# Patient Record
Sex: Male | Born: 1970
Health system: Southern US, Community
[De-identification: ages and names within clinical notes are randomized; demographics above are authoritative.]

## PROBLEM LIST (undated history)

## (undated) DIAGNOSIS — I10 Essential (primary) hypertension: Secondary | ICD-10-CM

## (undated) HISTORY — PX: KNEE SURGERY: SHX244

## (undated) HISTORY — DX: Essential (primary) hypertension: I10

---
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
## 2015-11-06 ENCOUNTER — Ambulatory Visit: Payer: Self-pay

## 2015-11-06 ENCOUNTER — Other Ambulatory Visit: Payer: Self-pay | Admitting: Occupational Medicine

## 2015-11-06 DIAGNOSIS — M25562 Pain in left knee: Secondary | ICD-10-CM

## 2017-11-21 HISTORY — PX: ROTATOR CUFF REPAIR: SHX139

## 2019-08-14 ENCOUNTER — Encounter: Payer: Self-pay | Admitting: Family Medicine

## 2019-08-14 ENCOUNTER — Ambulatory Visit: Payer: 59 | Admitting: Family Medicine

## 2019-08-14 ENCOUNTER — Other Ambulatory Visit: Payer: Self-pay

## 2019-08-14 VITALS — BP 152/92 | HR 102 | Temp 97.9°F | Ht 70.0 in | Wt 268.0 lb

## 2019-08-14 DIAGNOSIS — L918 Other hypertrophic disorders of the skin: Secondary | ICD-10-CM

## 2019-08-14 DIAGNOSIS — I10 Essential (primary) hypertension: Secondary | ICD-10-CM | POA: Diagnosis not present

## 2019-08-14 DIAGNOSIS — D1801 Hemangioma of skin and subcutaneous tissue: Secondary | ICD-10-CM | POA: Insufficient documentation

## 2019-08-14 DIAGNOSIS — I781 Nevus, non-neoplastic: Secondary | ICD-10-CM | POA: Diagnosis not present

## 2019-08-14 MED ORDER — AMLODIPINE BESYLATE 5 MG PO TABS: 5.0000 mg | ORAL_TABLET | Freq: Every day | ORAL | 3 refills | Status: DC

## 2019-08-14 MED FILL — AMLODIPINE BESYLATE 5 MG TA: 5 | 30 days supply | Qty: 30 | Fill #0

## 2019-08-14 NOTE — Patient Instructions (Addendum)
Around 3 times per week, check your blood pressure 4 times per day. Twice in the morning and twice in the evening. The readings should be at least one minute apart. Write down these values and bring them to your next nurse visit/appointment.  When you check your BP, make sure you have been doing something calm/relaxing 5 minutes prior to checking. Both feet should be flat on the floor and you should be sitting. Use your left arm and make sure it is in a relaxed position (on a table), and that the cuff is at the approximate level/height of your heart.  Keep the diet clean and stay active.  Aim to do some physical exertion for 150 minutes per week. This is typically divided into 5 days per week, 30 minutes per day. The activity should be enough to get your heart rate up. Anything is better than nothing if you have time constraints.  There is nothing on your back that looks worrisome to be. There are some skin tags and other benign appearing lesions. If anything changes or new arises, please let me know.   Let us know if you need anything.

## 2019-08-14 NOTE — Progress Notes (Signed)
Chief Complaint  Patient presents with  . New Patient (Initial Visit)  . Hypertension       New Patient Visit SUBJECTIVE: HPI: Jonathon Colon is an 48 y.o.male who is being seen for establishing care.  The patient has not had a PCP in many years.  Hypertension Patient presents for hypertension follow up. He does not routinely monitor home blood pressures. He is not currently on any medications.  He has been on lisinopril in the past but it never works nor did he like the way it made him feel. He is sometimes adhering to a healthy diet overall. Exercise: active in yard and at work sometimes.  No chest pain or shortness of breath.  Over the past 6 months, his wife has noticed lesions on his back that have popped up.  Unsure if it is changing.  There are multiple.  No personal or family history of skin cancer.  There are other forms of cancer through his aunts and uncles including breast cancer, liver cancer, and brain cancer.  He is fair skinned and is compliant with sunscreen wearing recommendations, even as a child.   No Known Allergies  Past Medical History:  Diagnosis Date  . Hypertension    Past Surgical History:  Procedure Laterality Date  . ROTATOR CUFF REPAIR Right 2019  . TONSILLECTOMY  1974   Family History  Problem Relation Age of Onset  . COPD Mother   . Heart failure Father   . Hypertension Father   . Hyperlipidemia Father   . Hypertension Sister   . Diabetes Sister    No Known Allergies  Current Outpatient Medications:  .  amLODipine (NORVASC) 5 MG tablet, Take 1 tablet (5 mg total) by mouth daily., Disp: 30 tablet, Rfl: 3  ROS Cardiovascular: Denies chest pain  Respiratory: Denies dyspnea   OBJECTIVE: BP (!) 152/92 (BP Location: Left Arm, Patient Position: Sitting, Cuff Size: Large)   Pulse (!) 102   Temp 97.9 F (36.6 C) (Temporal)   Ht 5\' 10"  (1.778 m)   Wt 268 lb (121.6 kg)   SpO2 97%   BMI 38.45 kg/m   Constitutional: -  VS reviewed -   Well developed, well nourished, appears stated age -  No apparent distress  Psychiatric: -  Oriented to person, place, and time -  Memory intact -  Affect and mood normal -  Fluent conversation, good eye contact -  Judgment and insight age appropriate  Eye: -  Conjunctivae clear, no discharge -  Pupils symmetric, round, reactive to light  ENMT: -  MMM    Pharynx moist, no exudate, no erythema  Neck: -  No gross swelling, no palpable masses -  Thyroid midline, not enlarged, mobile, no palpable masses  Cardiovascular: -  RRR -  No bruits -  No LE edema  Respiratory: -  Normal respiratory effort, no accessory muscle use, no retraction -  Breath sounds equal, no wheezes, no ronchi, no crackles  Skin: -  Various skin tags, seborrheic keratoses, and angiomas on his back; there are also scattered lentigo; no suspicious lesions noted -  Warm and dry to palpation   ASSESSMENT/PLAN: Essential hypertension - Plan: amLODipine (NORVASC) 5 MG tablet  Skin tag  Cherry angioma  Add amlodipine.  Counseled on diet and exercise.  Recommended home blood pressure monitoring.  We will recheck in 1 month. Reassurance given for the skin lesions on his back.  Recommended if there is any concern, take a picture and send  it to be in my chart.  Skin lesions that are changing are of concern.  This is not the case with him. Patient should return in 1 month for a physical. The patient voiced understanding and agreement to the plan.   [place], DO 08/14/19  12:17 PM

## 2019-09-18 ENCOUNTER — Encounter: Payer: Self-pay | Admitting: Family Medicine

## 2019-09-18 ENCOUNTER — Ambulatory Visit (INDEPENDENT_AMBULATORY_CARE_PROVIDER_SITE_OTHER): Payer: 59 | Admitting: Family Medicine

## 2019-09-18 ENCOUNTER — Other Ambulatory Visit: Payer: Self-pay

## 2019-09-18 VITALS — BP 122/72 | HR 113 | Temp 97.9°F | Ht 70.0 in | Wt 272.4 lb

## 2019-09-18 DIAGNOSIS — M7918 Myalgia, other site: Secondary | ICD-10-CM

## 2019-09-18 DIAGNOSIS — Z1211 Encounter for screening for malignant neoplasm of colon: Secondary | ICD-10-CM

## 2019-09-18 DIAGNOSIS — R0681 Apnea, not elsewhere classified: Secondary | ICD-10-CM | POA: Insufficient documentation

## 2019-09-18 DIAGNOSIS — G4700 Insomnia, unspecified: Secondary | ICD-10-CM | POA: Insufficient documentation

## 2019-09-18 DIAGNOSIS — Z0000 Encounter for general adult medical examination without abnormal findings: Secondary | ICD-10-CM | POA: Diagnosis not present

## 2019-09-18 DIAGNOSIS — I10 Essential (primary) hypertension: Secondary | ICD-10-CM | POA: Diagnosis not present

## 2019-09-18 DIAGNOSIS — Z23 Encounter for immunization: Secondary | ICD-10-CM

## 2019-09-18 LAB — COMPREHENSIVE METABOLIC PANEL
ALT: 27 U/L (ref 0–53)
AST: 19 U/L (ref 0–37)
Albumin: 4.6 g/dL (ref 3.5–5.2)
Alkaline Phosphatase: 100 U/L (ref 39–117)
BUN: 11 mg/dL (ref 6–23)
CO2: 27 mEq/L (ref 19–32)
Calcium: 10 mg/dL (ref 8.4–10.5)
Chloride: 103 mEq/L (ref 96–112)
Creatinine, Ser: 0.77 mg/dL (ref 0.40–1.50)
GFR: 106.92 mL/min (ref >60.00)
Glucose, Bld: 132 mg/dL — ABNORMAL HIGH (ref 70–99)
Potassium: 4.4 mEq/L (ref 3.5–5.1)
Sodium: 139 mEq/L (ref 135–145)
Total Bilirubin: 0.5 mg/dL (ref 0.2–1.2)
Total Protein: 7.5 g/dL (ref 6.0–8.3)

## 2019-09-18 LAB — LIPID PANEL
Cholesterol: 154 mg/dL (ref 0–200)
HDL: 35.5 mg/dL — ABNORMAL LOW (ref >39.00)
NonHDL: 118.36
Total CHOL/HDL Ratio: 4
Triglycerides: 235 mg/dL — ABNORMAL HIGH (ref 0.0–149.0)
VLDL: 47 mg/dL — ABNORMAL HIGH (ref 0.0–40.0)

## 2019-09-18 LAB — CBC
HCT: 42.1 % (ref 39.0–52.0)
Hemoglobin: 14.8 g/dL (ref 13.0–17.0)
MCHC: 35.1 g/dL (ref 30.0–36.0)
MCV: 88.8 fl (ref 78.0–100.0)
Platelets: 296 10*3/uL (ref 150.0–400.0)
RBC: 4.75 Mil/uL (ref 4.22–5.81)
RDW: 13.4 % (ref 11.5–15.5)
WBC: 8.8 10*3/uL (ref 4.0–10.5)

## 2019-09-18 LAB — LDL CHOLESTEROL, DIRECT: Direct LDL: 98 mg/dL

## 2019-09-18 MED ORDER — TRAZODONE HCL 50 MG PO TABS: 25.0000 mg | ORAL_TABLET | Freq: Every evening | ORAL | 3 refills | Status: DC | PRN

## 2019-09-18 MED FILL — traZODone HCL 50 MG TABS: 50 | 30 days supply | Qty: 30 | Fill #0

## 2019-09-18 NOTE — Patient Instructions (Addendum)
Give [place] 2-3 business days to get the results of your labs back.   Keep the diet clean and stay active.  If you do not hear anything about your referral in the next 1-2 weeks, call our office and ask for an update.  The new Shingrix vaccine (for shingles) is a 2 shot series. It can make people feel low energy, achy and almost like they have the flu for 48 hours after injection. Please plan accordingly when deciding on when to get this shot. Call our office for a nurse visit appointment to get this. The second shot of the series is less severe regarding the side effects, but it still lasts 48 hours.    Bring your blood pressure monitor to your nurse visit. An alternative would be to have it checked at work.    Let us know if you need anything.  Healthy Eating Plan Many factors influence your heart health, including eating and exercise habits. Heart (coronary) risk increases with abnormal blood fat (lipid) levels. Heart-healthy meal planning includes limiting unhealthy fats, increasing healthy fats, and making other small dietary changes. This includes maintaining a healthy body weight to help keep lipid levels within a normal range.  WHAT IS MY PLAN?  Your health care provider recommends that you:  Drink a glass of water before meals to help with satiety.  Eat slowly.  An alternative to the water is to add Metamucil. This will help with satiety as well. It does contain calories, unlike water.  WHAT TYPES OF FAT SHOULD I CHOOSE?  Choose healthy fats more often. Choose monounsaturated and polyunsaturated fats, such as olive oil and canola oil, flaxseeds, walnuts, almonds, and seeds.  Eat more omega-3 fats. Good choices include salmon, mackerel, sardines, tuna, flaxseed oil, and ground flaxseeds. Aim to eat fish at least two times each week.  Avoid foods with partially hydrogenated oils in them. These contain trans fats. Examples of foods that contain trans fats are stick margarine, some tub  margarines, cookies, crackers, and other baked goods. If you are going to avoid a fat, this is the one to avoid!  WHAT GENERAL GUIDELINES DO I NEED TO FOLLOW?  Check food labels carefully to identify foods with trans fats. Avoid these types of options when possible.  Fill one half of your plate with vegetables and green salads. Eat 4-5 servings of vegetables per day. A serving of vegetables equals 1 cup of raw leafy vegetables,  cup of raw or cooked cut-up vegetables, or  cup of vegetable juice.  Fill one fourth of your plate with whole grains. Look for the word "whole" as the first word in the ingredient list.  Fill one fourth of your plate with lean protein foods.  Eat 4-5 servings of fruit per day. A serving of fruit equals one medium whole fruit,  cup of dried fruit,  cup of fresh, frozen, or canned fruit. Try to avoid fruits in cups/syrups as the sugar content can be high.  Eat more foods that contain soluble fiber. Examples of foods that contain this type of fiber are apples, broccoli, carrots, beans, peas, and barley. Aim to get 20-30 g of fiber per day.  Eat more home-cooked food and less restaurant, buffet, and fast food.  Limit or avoid alcohol.  Limit foods that are high in starch and sugar.  Avoid fried foods when able.  Cook foods by using methods other than frying. Baking, boiling, grilling, and broiling are all great options. Other fat-reducing suggestions include: ?  Removing the skin from poultry. ? Removing all visible fats from meats. ? Skimming the fat off of stews, soups, and gravies before serving them. ? Steaming vegetables in water or broth.  Lose weight if you are overweight. Losing just 5-10% of your initial body weight can help your overall health and prevent diseases such as diabetes and heart disease.  Increase your consumption of nuts, legumes, and seeds to 4-5 servings per week. One serving of dried beans or legumes equals  cup after being cooked,  one serving of nuts equals 1 ounces, and one serving of seeds equals  ounce or 1 tablespoon.  WHAT ARE GOOD FOODS CAN I EAT? Grains Grainy breads (try to find bread that is 3 g of fiber per slice or greater), oatmeal, light popcorn. Whole-grain cereals. Rice and pasta, including brown rice and those that are made with whole wheat. Edamame pasta is a great alternative to grain pasta. It has a higher protein content. Try to avoid significant consumption of white bread, sugary cereals, or pastries/baked goods.  Vegetables All vegetables. Cooked white potatoes do not count as vegetables.  Fruits All fruits, but limit pineapple and bananas as these fruits have a higher sugar content.  Meats and Other Protein Sources Lean, well-trimmed beef, veal, pork, and lamb. Chicken and [place] without skin. All fish and shellfish. Wild duck, rabbit, pheasant, and venison. Egg whites or low-cholesterol egg substitutes. Dried beans, peas, lentils, and tofu.Seeds and most nuts.  Dairy Low-fat or nonfat cheeses, including ricotta, string, and mozzarella. Skim or 1% milk that is liquid, powdered, or evaporated. Buttermilk that is made with low-fat milk. Nonfat or low-fat yogurt. Soy/Almond milk are good alternatives if you cannot handle dairy.  Beverages Water is the best for you. Sports drinks with less sugar are more desirable unless you are a highly active athlete.  Sweets and Desserts Sherbets and fruit ices. Honey, jam, marmalade, jelly, and syrups. Dark chocolate.  Eat all sweets and desserts in moderation.  Fats and Oils Nonhydrogenated (trans-free) margarines. Vegetable oils, including soybean, sesame, sunflower, olive, peanut, safflower, corn, canola, and cottonseed. Salad dressings or mayonnaise that are made with a vegetable oil. Limit added fats and oils that you use for cooking, baking, salads, and as spreads.  Other Cocoa powder. Coffee and tea. Most condiments.  The items listed above may  not be a complete list of recommended foods or beverages. Contact your dietitian for more options.  Gluteus Rehab It is normal to feel mild stretching, pulling, tightness, or discomfort as you do these exercises, but you should stop right away if you feel sudden pain or your pain gets worse.   Stretching and range of motion exercise This exercise warms up your muscles and joints and improves the movement and flexibility of your hip and pelvis. This exercise also helps to relieve pain and stiffness. Exercise A: Lunge (hip flexor stretch)     1. Kneel on the floor on your left / right knee. Bend your other knee so it is directly over your ankle. 2. Keep good posture with your head over your shoulders. Tuck your tailbone underneath you. This will prevent your back from arching too much. 3. You should feel a gentle stretch in the front of your thigh or hip. If you do not feel a stretch, slowly lunge forward with your chest up. 4. Hold this position for 30 seconds. 5. Slowly return to the starting position. Repeat 2 times. Complete this exercise 3 times per week. Strengthening exercises  These exercises build strength and endurance in your hip and pelvis. Endurance is the ability to use your muscles for a long time, even after they get tired. Exercise B: Bridge (hip extensors)    1. Lie on your back on a firm surface with your knees bent and your feet flat on the floor. 2. Tighten your buttocks muscles and lift your bottom off the floor until the trunk of your body is level with your thighs. ? You should feel the muscles working in your buttocks and the back of your thighs. If this exercise is too easy, cross your arms over your chest or lift one leg while your bottom is up off the floor. ? Do not arch your back. 3. Hold this position for 3 seconds. 4. Slowly lower your hips to the starting position. 5. Let your muscles relax completely between repetitions. Repeat 2 times. Complete this  exercise 3 times per week. Exercise C: Straight leg raises (hip abductors)    1. Lie on your side with your left / right leg in the top position. Lie so your head, shoulder, knee, and hip line up. Bend your bottom knee to help you balance. 2. Lift your top leg up 4-6 inches (10-15 cm), keeping your toes pointed straight ahead. 3. Hold this position for 2 seconds. 4. Slowly lower your leg to the starting position and let your muscles relax completely. Repeat for a total of 10 repetitions. Repeat 2 times. Complete this exercise 3 times per week. Exercise D: Hip abductors and external rotators, quadruped 1. Get on your hands and knees on a firm, lightly padded surface. Your hands should be directly below your shoulders, and your knees should be directly below your hips. 2. Lift your left / right knee out to the side. Keep your knee bent. Do not twist your body. 3. Hold this position for 3 seconds. 4. Slowly lower your leg. Repeat for a total of 10 repetitions.  Repeat 2 times. Complete this exercise 3 times per week. Exercise E: Single leg stand 1. Stand near a counter or door frame to hold onto as needed. It is helpful to look in a mirror for this exercise so you can watch your hip. 2. Squeeze your left / right buttock muscles then lift up your other foot. Do not let your left / righthip push out to the side. 3. Hold this position for 3 seconds. Repeat for a total of 10 repetitions. Repeat 2 times. Complete this exercise 3 times per week. Make sure you discuss any questions you have with your health care provider. Document Released: 11/07/2005 Document Revised: 07/14/2016 Document Reviewed: 10/20/2015 Elsevier Interactive Patient Education  [employer].

## 2019-09-18 NOTE — Progress Notes (Signed)
Chief Complaint  Patient presents with  . Annual Exam    Well Male Jonathon Colon is here for a complete physical.   His last physical was >1 year ago.  Current diet: in general, diet is fair.  Current exercise: active at home Weight trend: stable Awake time fatigue? No. Seat belt? Yes.    Health maintenance Shingrix- No Colonoscopy- No Tetanus- Yes HIV- Yes   Hypertension Patient presents for hypertension follow up. He does monitor home blood pressures. Blood pressures ranging on average from 140's/80's. He is compliant with medication- Norvasc. Patient has these side effects of medication: none  Patient has been experiencing witnessed apneic episodes.  He has never had a sleep study.  He does have trouble falling asleep.  Right shoulder pain will sometimes prevent him from sleeping on his right side, which is his usual side he sleeps on.  No racing thoughts, no issues with urination.  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  1.5 weeks ago, the patient pivoted on his right foot and felt a pop in his right buttock region.  He has been having issues ever since then.  Some difficulty walking.  Normal range of motion but is feeling an ache in that area.  Has not tried anything from a medicine standpoint.  No bruising, redness, or swelling.  Denies any weakness.   Past Medical History:  Diagnosis Date  . Hypertension       Past Surgical History:  Procedure Laterality Date  . ROTATOR CUFF REPAIR Right 2019  . TONSILLECTOMY  1974    Medications  Current Outpatient Medications on File Prior to Visit  Medication Sig Dispense Refill  . amLODipine (NORVASC) 5 MG tablet Take 1 tablet (5 mg total) by mouth daily. 30 tablet 3   Allergies No Known Allergies  Family History Family History  Problem Relation Age of Onset  . COPD Mother   . Heart failure Father   . Hypertension Father   . Hyperlipidemia Father   . Hypertension Sister   . Diabetes Sister     Review of  Systems: Constitutional:  no fevers Eye:  no recent significant change in vision Ear/Nose/Mouth/Throat:  Ears:  no hearing loss Nose/Mouth/Throat:  no complaints of nasal congestion, no sore throat Cardiovascular:  no chest pain, no palpitations Respiratory:  no cough and no shortness of breath Gastrointestinal:  no abdominal pain, no change in bowel habits GU:  Male: negative for dysuria, frequency, and incontinence and negative for prostate symptoms Musculoskeletal/Extremities: + Buttock pain as noted above, no pain, redness, or swelling of the joints Integumentary (Skin/Breast):  no abnormal skin lesions reported Neurologic:  no headaches Endocrine: No unexpected weight changes Hematologic/Lymphatic:  no abnormal bleeding  Exam BP 122/72 (BP Location: Left Arm, Patient Position: Sitting, Cuff Size: Large)   Pulse (!) 113   Temp 97.9 F (36.6 C) (Temporal)   Ht 5\' 10"  (1.778 m)   Wt 272 lb 6 oz (123.5 kg)   SpO2 97%   BMI 39.08 kg/m  General:  well developed, well nourished, in no apparent distress Skin:  no significant moles, warts, or growths Head:  no masses, lesions, or tenderness Eyes:  pupils equal and round, sclera anicteric without injection Ears:  canals without lesions, TMs shiny without retraction, no obvious effusion, no erythema Nose:  nares patent, septum midline, mucosa normal Throat/Pharynx:  lips and gingiva without lesion; tongue and uvula midline; non-inflamed pharynx; no exudates or postnasal drainage Neck: neck supple  without adenopathy, thyromegaly, or masses Lungs:  clear to auscultation, breath sounds equal bilaterally, no respiratory distress Cardio:  regular rate and rhythm (HR 84), 2+ pitting b/l LE edema, no bruits Abdomen:  abdomen soft, nontender; bowel sounds normal; no masses or organomegaly Rectal: Deferred Musculoskeletal: Mild tenderness over the cephalad portion of the right gluteus laterally, there is no tenderness over the greater  trochanter, no pain with resisted abduction; negative logroll, Valentino Hue, [place], otherwise symmetrical muscle groups noted without atrophy or deformity Extremities:  no clubbing, cyanosis, or edema, no deformities, no skin discoloration Neuro:  gait normal; deep tendon reflexes normal and symmetric Psych: well oriented with normal range of affect and appropriate judgment/insight  Assessment and Plan  Well adult exam - Plan: CBC, Comprehensive metabolic panel, Lipid panel  Need for influenza vaccination - Plan: Flu Vaccine QUAD 6+ mos PF IM (Fluarix Quad PF)  Screen for colon cancer - Plan: Ambulatory referral to Gastroenterology  Witnessed apneic spells - Plan: Ambulatory referral to Pulmonology  Insomnia, unspecified type - Plan: traZODone (DESYREL) 50 MG tablet  Buttock pain  Essential hypertension   Well 47 y.o. male. Counseled on diet and exercise. Counseled on risks and benefits of prostate cancer screening with PSA. The patient agrees to forego testing. Immunizations, labs, and further orders as above. Refer to pulmonology for witnessed apneic spells. Start trazodone for insomnia.  If this does not help, I would consider a shoulder injection as if he is not having any pain, he will be able to sleep on his preferred side. Stretches and exercises given for buttock pain. Continue Norvasc for now, I will have him check his blood pressure at home and bring his monitor in for a nurse visit where we will compare the readings and verify validity. Follow up for BP check in 1-2 weeks. The patient voiced understanding and agreement to the plan.  [place], DO 09/18/19 12:05 PM

## 2019-09-19 ENCOUNTER — Other Ambulatory Visit: Payer: Self-pay | Admitting: Family Medicine

## 2019-09-19 ENCOUNTER — Encounter: Payer: Self-pay | Admitting: Family Medicine

## 2019-09-19 ENCOUNTER — Other Ambulatory Visit (INDEPENDENT_AMBULATORY_CARE_PROVIDER_SITE_OTHER): Payer: 59

## 2019-09-19 DIAGNOSIS — R739 Hyperglycemia, unspecified: Secondary | ICD-10-CM

## 2019-09-19 LAB — HEMOGLOBIN A1C: Hgb A1c MFr Bld: 6.2 % (ref 4.6–6.5)

## 2019-09-19 NOTE — Progress Notes (Unsigned)
st

## 2019-09-20 ENCOUNTER — Other Ambulatory Visit: Payer: Self-pay | Admitting: Family Medicine

## 2019-09-20 DIAGNOSIS — E785 Hyperlipidemia, unspecified: Secondary | ICD-10-CM

## 2019-09-20 MED ORDER — GABAPENTIN 400 MG PO CAPS: 800.0000 mg | ORAL_CAPSULE | Freq: Every day | ORAL | 5 refills | Status: DC

## 2019-09-20 MED FILL — GABAPENTIN 400 MG CAPSULE: 400 | 30 days supply | Qty: 60 | Fill #0

## 2019-09-30 ENCOUNTER — Encounter: Payer: Self-pay | Admitting: Family Medicine

## 2019-10-03 ENCOUNTER — Other Ambulatory Visit (INDEPENDENT_AMBULATORY_CARE_PROVIDER_SITE_OTHER): Payer: 59

## 2019-10-03 ENCOUNTER — Other Ambulatory Visit: Payer: Self-pay

## 2019-10-03 DIAGNOSIS — E785 Hyperlipidemia, unspecified: Secondary | ICD-10-CM | POA: Diagnosis not present

## 2019-10-04 LAB — LIPID PANEL
Cholesterol: 157 mg/dL (ref 0–200)
HDL: 33.7 mg/dL — ABNORMAL LOW (ref >39.00)
LDL Cholesterol: 86 mg/dL (ref 0–99)
NonHDL: 123.44
Total CHOL/HDL Ratio: 5
Triglycerides: 185 mg/dL — ABNORMAL HIGH (ref 0.0–149.0)
VLDL: 37 mg/dL (ref 0.0–40.0)

## 2019-10-07 MED FILL — AMLODIPINE BESYLATE 5 MG TA: 5 | 90 days supply | Qty: 90 | Fill #1

## 2019-10-11 ENCOUNTER — Encounter: Payer: Self-pay | Admitting: Family Medicine

## 2019-10-11 ENCOUNTER — Ambulatory Visit: Payer: 59 | Admitting: Family Medicine

## 2019-10-11 ENCOUNTER — Other Ambulatory Visit: Payer: Self-pay

## 2019-10-11 VITALS — BP 140/102 | HR 117 | Temp 98.0°F | Ht 70.0 in | Wt 273.2 lb

## 2019-10-11 DIAGNOSIS — D489 Neoplasm of uncertain behavior, unspecified: Secondary | ICD-10-CM | POA: Diagnosis not present

## 2019-10-11 DIAGNOSIS — D171 Benign lipomatous neoplasm of skin and subcutaneous tissue of trunk: Secondary | ICD-10-CM | POA: Diagnosis not present

## 2019-10-11 NOTE — Patient Instructions (Signed)
Do not shower for the rest of the day. When you do wash it, use only soap and water. Do not vigorously scrub. Apply triple antibiotic ointment (like Neosporin) twice daily. Keep the area clean and dry.   Things to look out for: increasing pain not relieved by ibuprofen/acetaminophen, fevers, spreading redness, drainage of pus, or foul odor.  Let us know if you need anything. 

## 2019-10-12 ENCOUNTER — Encounter: Payer: Self-pay | Admitting: Family Medicine

## 2019-10-12 NOTE — Progress Notes (Addendum)
CC: Skin lesion removal  Pt a 48 yo WM here for skin lesion.   On R buttock, very irritating. Scaly and bleeds sometimes as well. No new lotions/topicals/detergents. Has not tried anything thus far. He would like it removed.  ROS Skin: +lesion on buttock  Past Medical History:  Diagnosis Date  . Hypertension    BP (!) 140/102 (BP Location: Left Arm, Patient Position: Sitting, Cuff Size: Large)   Pulse (!) 117   Temp 98 F (36.7 C) (Temporal)   Ht 5\' 10"  (1.778 m)   Wt 273 lb 4 oz (123.9 kg)   SpO2 97%   BMI 39.21 kg/m  Gen: awake, alert Lungs: no access msc use Psych: Age appropriate judgement and insight, normal affect and mood.  Skin: there is an appendage like structure measuring approx 3 cm in length. The base dimensions are 0.8 cm x 0.4 cm.   Procedure note; shave biopsy Informed consent was obtained. The area was cleaned with alcohol and injected with 2 mL of 1% lidocaine with epinephrine. A Dermablade was slightly bent and used to cut under the area of interest. The specimen was placed in a sterile specimen cup and sent to the lab. The area was then cauterized ensuring adequate hemostasis. The area was dressed with triple antibiotic ointment and a bandage. There were no complications noted. The patient tolerated the procedure well.  Neoplasm of uncertain behavior - Plan: Surgical pathology( [facility]/ [facility]), CANCELED: Surgical pathology( [place])  Warning signs and symptoms verbalized and written down in AVS.  Aftercare instructions also verbalized and written down.  F/u prn. Pt voiced understanding and agreement to the plan.   Jonathon Colon 12:22 PM 10/12/19

## 2019-10-21 MED FILL — GABAPENTIN 400 MG CAPSULE: 400 | 30 days supply | Qty: 60 | Fill #1

## 2019-10-21 MED FILL — traZODone HCL 50 MG TABS: 50 | 30 days supply | Qty: 30 | Fill #1

## 2019-10-22 ENCOUNTER — Encounter: Payer: Self-pay | Admitting: Family Medicine

## 2019-11-13 ENCOUNTER — Telehealth: Payer: 59 | Admitting: Family

## 2019-11-13 ENCOUNTER — Encounter: Payer: Self-pay | Admitting: Family Medicine

## 2019-11-13 DIAGNOSIS — R059 Cough, unspecified: Secondary | ICD-10-CM

## 2019-11-13 DIAGNOSIS — R05 Cough: Secondary | ICD-10-CM

## 2019-11-13 MED ORDER — PROMETHAZINE-DM 6.25-15 MG/5ML PO SYRP: 5.0000 mL | ORAL_SOLUTION | Freq: Three times a day (TID) | ORAL | 0 refills | Status: DC | PRN

## 2019-11-13 MED ORDER — BENZONATATE 100 MG PO CAPS: 100.0000 mg | ORAL_CAPSULE | Freq: Three times a day (TID) | ORAL | 0 refills | Status: DC | PRN

## 2019-11-13 MED FILL — PROMETHAZINE W/DM SYRUP: 6.25-15 | 8 days supply | Qty: 118 | Fill #0

## 2019-11-13 MED FILL — BENZONATATE 100 MG CAPS: 100 | 7 days supply | Qty: 20 | Fill #0

## 2019-11-13 NOTE — Progress Notes (Signed)
E-Visit for Corona Virus Screening   Your current symptoms could be consistent with the coronavirus.  Many health care providers can now test patients at their office but not all are.  [facility] has multiple testing sites. For information on our [place] testing locations and hours go to [email or web address]  We are enrolling you in our [place] for [place] . Daily you will receive a questionnaire within the [place] website. Our COVID 19 response team will be monitoring your responses daily.  Testing Information: The COVID-19 Community Testing sites will begin testing BY APPOINTMENT ONLY.  You can schedule online at [email or web address]  If you do not have access to a smart phone or computer you may call [phone] for an appointment.  Testing Locations: Appointment schedule is 8 am to 3:30 pm at all sites  [facility] indoors at [address] [place] [address] [facility]  indoors at [place]. [address], [place], [place] [address] [place] indoors at [address] [place] [address]  Additional testing sites in the Community:  . For [facility] Testing sites in [place]  FaceUpdate.uy  . For Pop-up testing sites in [place]  BowlDirectory.co.uk  . For Testing sites with regular hours [email or web address]  . For [place] [email or web address]  . For [facility] [email or web address]  . For [facility] testing in [place] and [employer] [email or web address]  . For [facility] testing in [facility]   [email or web address]  For  more  information about community testing call 4705055094   We are enrolling you in our [place] for [place] . Daily you will receive a questionnaire within the [place] website. Our COVID 19 response team will be monitoring your responses daily.  Please quarantine yourself while awaiting your test results. If you develop fever/cough/breathlessness, please stay home for 10 days with improving symptoms and until you have had 24 hours of no fever (without taking a fever reducer).  You should wear a mask or cloth face covering over your nose and mouth if you must be around other people or animals, including pets (even at home). Try to stay at least 6 feet away from other people. This will protect the people around you.  Please continue good preventive care measures, including:  frequent hand-washing, avoid touching your face, cover coughs/sneezes, stay out of crowds and keep a 6 foot distance from others.  COVID-19 is a respiratory illness with symptoms that are similar to the flu. Symptoms are typically mild to moderate, but there have been cases of severe illness and death due to the virus.   The following symptoms may appear 2-14 days after exposure: . Fever . Cough . Shortness of breath or difficulty breathing . Chills . Repeated shaking with chills . Muscle pain . Headache . Sore throat . New loss of taste or smell . Fatigue . Congestion or runny nose . Nausea or vomiting . Diarrhea  Go to the nearest hospital ED for assessment if fever/cough/breathlessness are severe or illness seems like a threat to life.  It is vitally important that if you feel that you have an infection such as this virus or any other virus that you stay home and away from places where you may spread it to others.  You should avoid contact with people age 41 and older.   You can use medication such as A prescription cough medication called Tessalon Perles 100 mg. You may take 1-2 capsules every 8 hours as  needed for cough and A prescription cough medication called Phenergan DM 6.25 mg/15 mg. You make take one teaspoon / 5 ml every 4-6 hours as needed for cough.  You may also take acetaminophen (Tylenol) as needed for fever.  Reduce your risk of any infection by using the same precautions used for avoiding the common cold or flu:  [place] Kitchen Wash your hands often with soap and warm water for at least 20 seconds.  If soap and water are not readily available, use an alcohol-based hand sanitizer with at least 60% alcohol.  . If coughing or sneezing, cover your mouth and nose by coughing or sneezing into the elbow areas of your shirt or coat, into a tissue or into your sleeve (not your hands). . Avoid shaking hands with others and consider head nods or verbal greetings only. . Avoid touching your eyes, nose, or mouth with unwashed hands.  . Avoid close contact with people who are sick. . Avoid places or events with large numbers of people in one location, like concerts or sporting events. . Carefully consider travel plans you have or are making. . If you are planning any travel outside or inside the [place], visit the CDC's Travelers' Health webpage for the latest health notices. . If you have some symptoms but not all symptoms, continue to monitor at home and seek medical attention if your symptoms worsen. . If you are having a medical emergency, call 911.  HOME CARE . Only take medications as instructed by your medical team. . Drink plenty of fluids and get plenty of rest. . A steam or ultrasonic humidifier can help if you have congestion.   GET HELP RIGHT AWAY IF YOU HAVE EMERGENCY WARNING SIGNS** FOR COVID-19. If you or someone is showing any of these signs seek emergency medical care immediately. Call 911 or proceed to your closest emergency facility if: . You develop worsening high fever. . Trouble breathing . Bluish lips or face . Persistent pain or pressure in the chest . New confusion . Inability to  wake or stay awake . You cough up blood. . Your symptoms become more severe  **This list is not all possible symptoms. Contact your medical provider for any symptoms that are sever or concerning to you.  MAKE SURE YOU   Understand these instructions.  Will watch your condition.  Will get help right away if you are not doing well or get worse.  Your e-visit answers were reviewed by a board certified advanced clinical practitioner to complete your personal care plan.  Depending on the condition, your plan could have included both over the counter or prescription medications.  If there is a problem please reply once you have received a response from your provider.  Your safety is important to [place].  If you have drug allergies check your prescription carefully.    You can use MyChart to ask questions about today's visit, request a non-urgent call back, or ask for a work or school excuse for 24 hours related to this e-Visit. If it has been greater than 24 hours you will need to follow up with your provider, or enter a new e-Visit to address those concerns. You will get an e-mail in the next two days asking about your experience.  I hope that your e-visit has been valuable and will speed your recovery. Thank you for using e-visits.  Approximately 5 minutes was spent documenting and reviewing patient's chart.

## 2019-11-18 MED FILL — traZODone HCL 50 MG TABS: 50 | 30 days supply | Qty: 30 | Fill #2

## 2019-11-18 MED FILL — GABAPENTIN 400 MG CAPSULE: 400 | 30 days supply | Qty: 60 | Fill #2

## 2019-11-21 ENCOUNTER — Encounter: Payer: Self-pay | Admitting: Family Medicine

## 2019-12-14 DIAGNOSIS — Z20828 Contact with and (suspected) exposure to other viral communicable diseases: Secondary | ICD-10-CM | POA: Diagnosis not present

## 2019-12-20 MED FILL — GABAPENTIN 400 MG CAPSULE: 400 | 30 days supply | Qty: 60 | Fill #3

## 2019-12-20 MED FILL — traZODone HCL 50 MG TABS: 50 | 30 days supply | Qty: 30 | Fill #3

## 2020-01-22 ENCOUNTER — Other Ambulatory Visit: Payer: Self-pay | Admitting: Family Medicine

## 2020-01-22 ENCOUNTER — Encounter: Payer: Self-pay | Admitting: Family Medicine

## 2020-01-22 DIAGNOSIS — G4700 Insomnia, unspecified: Secondary | ICD-10-CM

## 2020-01-22 MED FILL — GABAPENTIN 400 MG CAPSULE: 400 | 30 days supply | Qty: 60 | Fill #4

## 2020-01-22 MED FILL — traZODone HCL 50 MG TABS: 50 | 90 days supply | Qty: 90 | Fill #0

## 2020-01-22 NOTE — Telephone Encounter (Signed)
Last OV---10/11/2019 Last RF---10/282020--#30 with 3 refills.

## 2020-02-24 MED FILL — GABAPENTIN 400 MG CAPSULE: 400 | 30 days supply | Qty: 60 | Fill #5

## 2020-03-30 ENCOUNTER — Other Ambulatory Visit: Payer: Self-pay | Admitting: Family Medicine

## 2020-05-22 ENCOUNTER — Other Ambulatory Visit: Payer: Self-pay | Admitting: Family Medicine

## 2020-05-22 DIAGNOSIS — I10 Essential (primary) hypertension: Secondary | ICD-10-CM

## 2020-05-22 MED FILL — AMLODIPINE BESYLATE 5 MG TA: 5 | 90 days supply | Qty: 90 | Fill #0

## 2020-06-01 ENCOUNTER — Ambulatory Visit (INDEPENDENT_AMBULATORY_CARE_PROVIDER_SITE_OTHER): Payer: 59 | Admitting: Family Medicine

## 2020-06-01 ENCOUNTER — Other Ambulatory Visit: Payer: Self-pay

## 2020-06-01 ENCOUNTER — Encounter: Payer: Self-pay | Admitting: Family Medicine

## 2020-06-01 VITALS — BP 138/84 | HR 98 | Temp 98.5°F | Ht 70.0 in | Wt 267.1 lb

## 2020-06-01 DIAGNOSIS — L821 Other seborrheic keratosis: Secondary | ICD-10-CM | POA: Diagnosis not present

## 2020-06-01 DIAGNOSIS — L814 Other melanin hyperpigmentation: Secondary | ICD-10-CM | POA: Diagnosis not present

## 2020-06-01 MED FILL — GABAPENTIN 400 MG CAPSULE: 400 | 30 days supply | Qty: 60 | Fill #2

## 2020-06-01 NOTE — Patient Instructions (Signed)
Seborrheic Keratosis °A seborrheic keratosis is a common, noncancerous (benign) skin growth. These growths are velvety, waxy, rough, tan, brown, or black spots that appear on the skin. These skin growths can be flat or raised, and scaly. °What are the causes? °The cause of this condition is not known. °What increases the risk? °You are more likely to develop this condition if you: °· Have a family history of seborrheic keratosis. °· Are 50 or older. °· Are pregnant. °· Have had estrogen replacement therapy. °What are the signs or symptoms? °Symptoms of this condition include growths on the face, chest, shoulders, back, or other areas. These growths: °· Are usually painless, but may become irritated and itchy. °· Can be yellow, brown, black, or other colors. °· Are slightly raised or have a flat surface. °· Are sometimes rough or wart-like in texture. °· Are often velvety or waxy on the surface. °· Are round or oval-shaped. °· Often occur in groups, but may occur as a single growth. °How is this diagnosed? °This condition is diagnosed with a medical history and physical exam. °· A sample of the growth may be tested (skin biopsy). °· You may need to see a skin specialist (dermatologist). °How is this treated? °Treatment is not usually needed for this condition, unless the growths are irritated or bleed often. °· You may also choose to have the growths removed if you do not like their appearance. °? Most commonly, these growths are treated with a procedure in which liquid nitrogen is applied to "freeze" off the growth (cryosurgery). °? They may also be burned off with electricity (electrocautery) or removed by scraping (curettage). °Follow these instructions at home: °· Watch your growth for any changes. °· Keep all follow-up visits as told by your health care provider. This is important. °· Do not scratch or pick at the growth or growths. This can cause them to become irritated or infected. °Contact a health care  provider if: °· You suddenly have many new growths. °· Your growth bleeds, itches, or hurts. °· Your growth suddenly becomes larger or changes color. °Summary °· A seborrheic keratosis is a common, noncancerous (benign) skin growth. °· Treatment is not usually needed for this condition, unless the growths are irritated or bleed often. °· Watch your growth for any changes. °· Contact a health care provider if you suddenly have many new growths or your growth suddenly becomes larger or changes color. °· Keep all follow-up visits as told by your health care provider. This is important. °This information is not intended to replace advice given to you by your health care provider. Make sure you discuss any questions you have with your health care provider. °Document Revised: 03/22/2018 Document Reviewed: 03/22/2018 °Elsevier Patient Education © 2020 Elsevier Inc. ° °

## 2020-06-01 NOTE — Progress Notes (Signed)
Chief Complaint  Patient presents with   [place]    check on back    Jonathon Colon is a 49 y.o. male here for a skin complaint.  Duration: 1 week Location: upper back Pruritic? No Painful? No Drainage? No New soaps/lotions/topicals/detergents? No Sick contacts? No Other associated symptoms: new areas according to wife Therapies tried thus far: none  Past Medical History:  Diagnosis Date   Hypertension     BP 138/84 (BP Location: Left Arm, Patient Position: Sitting, Cuff Size: Large)    Pulse 98    Temp 98.5 F (36.9 C) (Oral)    Ht 5\' 10"  (1.778 m)    Wt 267 lb 2 oz (121.2 kg)    SpO2 97%    BMI 38.33 kg/m  Gen: awake, alert, appearing stated age Lungs: No accessory muscle use Skin: See below. No drainage, erythema, TTP, fluctuance, excoriation Psych: Age appropriate judgment and insight      Lentigo  Seborrheic keratoses  Pt has lentigos and SK's about his back. No sinister appearing lesion. I discussed referring to a dermatologist, but the pt does not have any concerning lesions to warrant seeing one at this time. Standing offer for that.  F/u prn. The patient voiced understanding and agreement to the plan.  [place], DO 06/01/20 4:46 PM

## 2020-07-02 MED FILL — GABAPENTIN 400 MG CAPSULE: 400 | 30 days supply | Qty: 60 | Fill #3

## 2020-07-13 DIAGNOSIS — H5213 Myopia, bilateral: Secondary | ICD-10-CM | POA: Diagnosis not present

## 2020-07-20 ENCOUNTER — Other Ambulatory Visit: Payer: Self-pay | Admitting: Family Medicine

## 2020-07-20 MED ORDER — TRAZODONE HCL 100 MG PO TABS
100.0000 mg | ORAL_TABLET | Freq: Every day | ORAL | 3 refills | Status: DC
Start: 2020-07-20 — End: 2020-12-03

## 2020-07-20 MED FILL — traZODone HCL 50 MG TABS: 50 | 90 days supply | Qty: 90 | Fill #2

## 2020-07-20 MED FILL — traZODone HCL 100 MG TABS: 100 | 30 days supply | Qty: 30 | Fill #0

## 2020-07-23 DIAGNOSIS — Z23 Encounter for immunization: Secondary | ICD-10-CM | POA: Diagnosis not present

## 2020-08-03 MED FILL — GABAPENTIN 400 MG CAPSULE: 400 | 30 days supply | Qty: 60 | Fill #4

## 2020-08-27 MED FILL — traZODone HCL 100 MG TABS: 100 | 30 days supply | Qty: 30 | Fill #1

## 2020-09-07 MED FILL — GABAPENTIN 400 MG CAPSULE: 400 | 30 days supply | Qty: 60 | Fill #5

## 2020-09-29 MED FILL — traZODone HCL 100 MG TABS: 100 | 30 days supply | Qty: 30 | Fill #2

## 2020-10-09 ENCOUNTER — Other Ambulatory Visit: Payer: Self-pay | Admitting: Family Medicine

## 2020-10-09 MED FILL — GABAPENTIN 400 MG CAPSULE: 400 | 30 days supply | Qty: 60 | Fill #0

## 2020-11-02 MED FILL — traZODone HCL 100 MG TABS: 100 | 30 days supply | Qty: 30 | Fill #3

## 2020-11-11 MED FILL — GABAPENTIN 400 MG CAPSULE: 400 | 30 days supply | Qty: 60 | Fill #1

## 2020-12-03 ENCOUNTER — Other Ambulatory Visit: Payer: Self-pay | Admitting: Family Medicine

## 2020-12-03 MED FILL — traZODone HCL 100 MG TABS: 100 | 30 days supply | Qty: 30 | Fill #0

## 2020-12-03 NOTE — Telephone Encounter (Signed)
Requesting:Trazodone 100mg  Contract:n/a UDS:n/a Last Visit:06/01/20 Next Visit:n/a Last Refill:07/20/20  Please Advise

## 2020-12-14 MED FILL — GABAPENTIN 400 MG CAPSULE: 400 | 30 days supply | Qty: 60 | Fill #2

## 2021-01-01 MED FILL — traZODone HCL 100 MG TABS: 100 | 30 days supply | Qty: 30 | Fill #1

## 2021-01-14 MED FILL — GABAPENTIN 400 MG CAPSULE: 400 | 30 days supply | Qty: 60 | Fill #3

## 2021-01-28 MED FILL — traZODone HCL 100 MG TABS: 100 | 30 days supply | Qty: 30 | Fill #2

## 2021-02-20 ENCOUNTER — Other Ambulatory Visit (HOSPITAL_BASED_OUTPATIENT_CLINIC_OR_DEPARTMENT_OTHER): Payer: Self-pay

## 2021-02-20 MED FILL — Gabapentin Cap 400 MG: ORAL | 30 days supply | Qty: 60 | Fill #0 | Status: AC

## 2021-02-26 ENCOUNTER — Other Ambulatory Visit (HOSPITAL_BASED_OUTPATIENT_CLINIC_OR_DEPARTMENT_OTHER): Payer: Self-pay

## 2021-02-26 MED FILL — Trazodone HCl Tab 100 MG: ORAL | 30 days supply | Qty: 30 | Fill #0 | Status: AC

## 2021-03-23 ENCOUNTER — Other Ambulatory Visit (HOSPITAL_BASED_OUTPATIENT_CLINIC_OR_DEPARTMENT_OTHER): Payer: Self-pay

## 2021-03-23 MED FILL — Gabapentin Cap 400 MG: ORAL | 30 days supply | Qty: 60 | Fill #1 | Status: AC

## 2021-03-30 ENCOUNTER — Other Ambulatory Visit (HOSPITAL_BASED_OUTPATIENT_CLINIC_OR_DEPARTMENT_OTHER): Payer: Self-pay

## 2021-03-30 ENCOUNTER — Other Ambulatory Visit: Payer: Self-pay | Admitting: Family Medicine

## 2021-03-30 MED ORDER — TRAZODONE HCL 100 MG PO TABS
ORAL_TABLET | Freq: Every day | ORAL | 3 refills | Status: DC
  Filled 2021-03-30: qty 90, 90d supply, fill #0
  Filled 2021-06-29: qty 90, 90d supply, fill #1
  Filled 2021-09-09: qty 90, 90d supply, fill #2

## 2021-03-30 NOTE — Telephone Encounter (Signed)
Last OV---06/01/2020 Last RF--#30 on 12/03/2020

## 2021-03-31 ENCOUNTER — Other Ambulatory Visit (HOSPITAL_BASED_OUTPATIENT_CLINIC_OR_DEPARTMENT_OTHER): Payer: Self-pay

## 2021-04-22 ENCOUNTER — Other Ambulatory Visit (HOSPITAL_BASED_OUTPATIENT_CLINIC_OR_DEPARTMENT_OTHER): Payer: Self-pay

## 2021-04-22 ENCOUNTER — Other Ambulatory Visit: Payer: Self-pay | Admitting: Family Medicine

## 2021-04-22 MED ORDER — GABAPENTIN 400 MG PO CAPS
ORAL_CAPSULE | Freq: Every day | ORAL | 5 refills | Status: DC
  Filled 2021-04-22: qty 60, 30d supply, fill #0
  Filled 2021-05-25: qty 60, 30d supply, fill #1
  Filled 2021-06-24: qty 60, 30d supply, fill #2
  Filled 2021-07-19: qty 60, 30d supply, fill #3
  Filled 2021-08-17: qty 60, 30d supply, fill #4
  Filled 2021-09-18: qty 60, 30d supply, fill #5

## 2021-04-30 ENCOUNTER — Other Ambulatory Visit (HOSPITAL_BASED_OUTPATIENT_CLINIC_OR_DEPARTMENT_OTHER): Payer: Self-pay

## 2021-05-14 ENCOUNTER — Other Ambulatory Visit: Payer: Self-pay | Admitting: Family Medicine

## 2021-05-14 MED ORDER — PROMETHAZINE-DM 6.25-15 MG/5ML PO SYRP: 5.0000 mL | ORAL_SOLUTION | Freq: Four times a day (QID) | ORAL | 0 refills | Status: DC | PRN

## 2021-05-19 ENCOUNTER — Other Ambulatory Visit: Payer: Self-pay

## 2021-05-19 ENCOUNTER — Ambulatory Visit: Payer: 59 | Admitting: Family Medicine

## 2021-05-19 ENCOUNTER — Other Ambulatory Visit (HOSPITAL_BASED_OUTPATIENT_CLINIC_OR_DEPARTMENT_OTHER): Payer: Self-pay

## 2021-05-19 ENCOUNTER — Encounter: Payer: Self-pay | Admitting: Family Medicine

## 2021-05-19 VITALS — BP 138/82 | HR 101 | Temp 98.2°F | Ht 70.5 in | Wt 282.0 lb

## 2021-05-19 DIAGNOSIS — J208 Acute bronchitis due to other specified organisms: Secondary | ICD-10-CM | POA: Diagnosis not present

## 2021-05-19 DIAGNOSIS — B9689 Other specified bacterial agents as the cause of diseases classified elsewhere: Secondary | ICD-10-CM

## 2021-05-19 MED ORDER — PROMETHAZINE-CODEINE 6.25-10 MG/5ML PO SYRP
5.0000 mL | ORAL_SOLUTION | Freq: Four times a day (QID) | ORAL | 0 refills | Status: DC | PRN
  Filled 2021-05-19: qty 120, 4d supply, fill #0

## 2021-05-19 MED ORDER — AZITHROMYCIN 250 MG PO TABS
ORAL_TABLET | ORAL | 0 refills | Status: DC
  Filled 2021-05-19: qty 6, 5d supply, fill #0

## 2021-05-19 MED ORDER — PREDNISONE 20 MG PO TABS
40.0000 mg | ORAL_TABLET | Freq: Every day | ORAL | 0 refills | Status: AC
  Filled 2021-05-19: qty 10, 5d supply, fill #0

## 2021-05-19 NOTE — Progress Notes (Signed)
Chief Complaint  Patient presents with   Cough    Nils Pyle here for URI complaints.  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  Past Medical History:  Diagnosis Date   Hypertension     BP 138/82   Pulse (!) 101   Temp 98.2 F (36.8 C) (Oral)   Ht 5' 10.5" (1.791 m)   Wt 282 lb (127.9 kg)   SpO2 98%   BMI 39.89 kg/m  General: Awake, alert, appears stated age 50: AT, [place], ears 100% obstructed w cerumen, nares patent w/o discharge, pharynx pink and without exudates, MMM Neck: No masses or asymmetry Heart: RRR Lungs: CTAB, no accessory muscle use Psych: Age appropriate judgment and insight, normal mood and affect  Acute bacterial bronchitis - Plan: azithromycin (ZITHROMAX) 250 MG tablet, promethazine-codeine (PHENERGAN WITH CODEINE) 6.25-10 MG/5ML syrup  Orders as above. Warnings about syrup verbalized and written down.  Continue to push fluids, practice good hand hygiene, cover mouth when coughing. F/u prn. If starting to experience fevers, shaking, or shortness of breath, seek immediate care. Pt voiced understanding and agreement to the plan.  [place], DO 05/19/21 7:15 AM

## 2021-05-19 NOTE — Patient Instructions (Signed)
Continue to push fluids, practice good hand hygiene, and cover your mouth if you cough.  If you start having fevers, shaking or shortness of breath, seek immediate care.  Keep me in the loop if we aren't turning the corner.   Let us know if you need anything.

## 2021-05-25 ENCOUNTER — Other Ambulatory Visit (HOSPITAL_BASED_OUTPATIENT_CLINIC_OR_DEPARTMENT_OTHER): Payer: Self-pay

## 2021-06-25 ENCOUNTER — Other Ambulatory Visit (HOSPITAL_BASED_OUTPATIENT_CLINIC_OR_DEPARTMENT_OTHER): Payer: Self-pay

## 2021-06-29 ENCOUNTER — Other Ambulatory Visit (HOSPITAL_BASED_OUTPATIENT_CLINIC_OR_DEPARTMENT_OTHER): Payer: Self-pay

## 2021-07-19 ENCOUNTER — Other Ambulatory Visit (HOSPITAL_BASED_OUTPATIENT_CLINIC_OR_DEPARTMENT_OTHER): Payer: Self-pay

## 2021-08-17 ENCOUNTER — Other Ambulatory Visit (HOSPITAL_BASED_OUTPATIENT_CLINIC_OR_DEPARTMENT_OTHER): Payer: Self-pay

## 2021-09-06 ENCOUNTER — Ambulatory Visit: Payer: 59 | Admitting: Family Medicine

## 2021-09-09 ENCOUNTER — Other Ambulatory Visit (HOSPITAL_BASED_OUTPATIENT_CLINIC_OR_DEPARTMENT_OTHER): Payer: Self-pay

## 2021-09-17 ENCOUNTER — Other Ambulatory Visit (HOSPITAL_BASED_OUTPATIENT_CLINIC_OR_DEPARTMENT_OTHER): Payer: Self-pay

## 2021-09-17 ENCOUNTER — Encounter: Payer: Self-pay | Admitting: Family Medicine

## 2021-09-17 ENCOUNTER — Ambulatory Visit (INDEPENDENT_AMBULATORY_CARE_PROVIDER_SITE_OTHER): Payer: 59 | Admitting: Family Medicine

## 2021-09-17 ENCOUNTER — Other Ambulatory Visit: Payer: Self-pay

## 2021-09-17 VITALS — BP 122/72 | HR 96 | Temp 98.1°F | Ht 70.0 in | Wt 279.1 lb

## 2021-09-17 DIAGNOSIS — R0981 Nasal congestion: Secondary | ICD-10-CM | POA: Diagnosis not present

## 2021-09-17 DIAGNOSIS — G4720 Circadian rhythm sleep disorder, unspecified type: Secondary | ICD-10-CM

## 2021-09-17 DIAGNOSIS — G4733 Obstructive sleep apnea (adult) (pediatric): Secondary | ICD-10-CM | POA: Diagnosis not present

## 2021-09-17 DIAGNOSIS — Z23 Encounter for immunization: Secondary | ICD-10-CM

## 2021-09-17 DIAGNOSIS — G4700 Insomnia, unspecified: Secondary | ICD-10-CM

## 2021-09-17 MED ORDER — BELSOMRA 20 MG PO TABS
20.0000 mg | ORAL_TABLET | Freq: Every evening | ORAL | 2 refills | Status: DC
Start: 2021-09-17 — End: 2021-12-30
  Filled 2021-09-17: qty 30, 30d supply, fill #0
  Filled 2021-10-11 – 2021-10-15 (×2): qty 30, 30d supply, fill #1
  Filled 2021-11-29: qty 30, 30d supply, fill #2

## 2021-09-17 MED ORDER — MONTELUKAST SODIUM 10 MG PO TABS
10.0000 mg | ORAL_TABLET | Freq: Every day | ORAL | 3 refills | Status: DC
  Filled 2021-09-17: qty 30, 30d supply, fill #0
  Filled 2021-10-11: qty 30, 30d supply, fill #1
  Filled 2021-11-15: qty 30, 30d supply, fill #2
  Filled 2021-12-14: qty 30, 30d supply, fill #3

## 2021-09-17 MED ORDER — TRAZODONE HCL 150 MG PO TABS
150.0000 mg | ORAL_TABLET | Freq: Every day | ORAL | 1 refills | Status: DC
  Filled 2021-09-17: qty 30, 30d supply, fill #0
  Filled 2021-10-19: qty 30, 30d supply, fill #1

## 2021-09-17 NOTE — Progress Notes (Signed)
Chief Complaint  Patient presents with   Medication Problem    Trazodone is not working    Subjective: Patient is a 50 y.o. male here for f/u insomnia. Here w spouse.   Taking trazodone and it is not helping any longer. He works night shifts and has sleep wake disorder. Mood is stable. He does not snore at night or wake up gasping for air. No racing thoughts. He will sometimes nap. No caffeine/alcohol use near bedtime. No blue light exposure near bedtime.   He has chronic nasal congestion helped by Allegra and INCS. Never has seen ENT or tried Singulair. No SOB, coughing, wheezing, fevers.   OSA- failed CPAP 2/2 anxiety. Considered INSPIRE but he was told he weighs too much. Never considered dental appliance which he is open to.   Past Medical History:  Diagnosis Date   Hypertension    Objective: BP 122/72   Pulse 96   Temp 98.1 F (36.7 C) (Oral)   Ht 5\' 10"  (1.778 m)   Wt 279 lb 2 oz (126.6 kg)   SpO2 97%   BMI 40.05 kg/m  General: Awake, appears stated age Lungs: No accessory muscle use Psych: Age appropriate judgment and insight, normal affect and mood  Assessment and Plan: Insomnia, unspecified type - Plan: Suvorexant (BELSOMRA) 20 MG TABS, traZODone (DESYREL) 150 MG tablet  Sleep-wake cycle disorder - Plan: Suvorexant (BELSOMRA) 20 MG TABS, traZODone (DESYREL) 150 MG tablet  OSA (obstructive sleep apnea) - Plan: Ambulatory referral to ENT  Chronic nasal congestion - Plan: Ambulatory referral to ENT, montelukast (SINGULAIR) 10 MG tablet  Need for influenza vaccination - Plan: Flu Vaccine QUAD 6+ mos PF IM (Fluarix Quad PF)  Chronic, uncontrolled. Stop trazodone tentatively while seeing if Belsomra is covered. It is somewhat working so if new med not covered, will have him take 150 mg/night dosage. LB BH info provided, sleep hygiene info provided.  Chronic nasal congestion, add Singulair to Allegra and Nasacort. Refer ENT. 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  F/u in 1 mo to [place] and for CPE.   The patient and his spouse voiced understanding and agreement to the plan.  [place], DO 09/17/21  4:54 PM

## 2021-09-17 NOTE — Patient Instructions (Addendum)
Sleep Hygiene Tips: Do not watch TV or look at screens within 1 hour of going to bed. If you do, make sure there is a blue light filter (nighttime mode) involved. Try to go to bed around the same time every night. Wake up at the same time within 1 hour of regular time. Ex: If you wake up at 7 AM for work, do not sleep past 8 AM on days that you don't work. Do not drink alcohol before bedtime. Do not consume caffeine-containing beverages after noon or within 9 hours of intended bedtime. Get regular exercise/physical activity in your life, but not within 2 hours of planned bedtime. Do not take naps.  Do not eat within 2 hours of planned bedtime. Melatonin, 3-5 mg 30-60 minutes before planned bedtime may be helpful.  The bed should be for sleep or sex only. If after 20-30 minutes you are unable to fall asleep, get up and do something relaxing. Do this until you feel ready to go to sleep again.   Sleep is important to [place] all. Getting good sleep is imperative to adequate functioning during the day. Work with our counselors who are trained to help people obtain quality sleep. Call [phone] to schedule an appointment or if you are curious about insurance coverage/cost.  Stay on the [place]. We are adding montelukast.   If you do not hear anything about your referral in the next 1-2 weeks, call our office and ask for an update.  Let us know if you need anything.

## 2021-09-20 ENCOUNTER — Other Ambulatory Visit (HOSPITAL_BASED_OUTPATIENT_CLINIC_OR_DEPARTMENT_OTHER): Payer: Self-pay

## 2021-10-01 ENCOUNTER — Other Ambulatory Visit (HOSPITAL_BASED_OUTPATIENT_CLINIC_OR_DEPARTMENT_OTHER): Payer: Self-pay

## 2021-10-01 ENCOUNTER — Other Ambulatory Visit: Payer: Self-pay

## 2021-10-01 ENCOUNTER — Telehealth (INDEPENDENT_AMBULATORY_CARE_PROVIDER_SITE_OTHER): Payer: 59 | Admitting: Family Medicine

## 2021-10-01 ENCOUNTER — Encounter: Payer: Self-pay | Admitting: Family Medicine

## 2021-10-01 DIAGNOSIS — R059 Cough, unspecified: Secondary | ICD-10-CM

## 2021-10-01 DIAGNOSIS — R0981 Nasal congestion: Secondary | ICD-10-CM

## 2021-10-01 DIAGNOSIS — R6889 Other general symptoms and signs: Secondary | ICD-10-CM | POA: Diagnosis not present

## 2021-10-01 MED ORDER — PROMETHAZINE-CODEINE 6.25-10 MG/5ML PO SYRP
5.0000 mL | ORAL_SOLUTION | Freq: Four times a day (QID) | ORAL | 0 refills | Status: DC | PRN
  Filled 2021-10-01: qty 120, 4d supply, fill #0

## 2021-10-01 MED ORDER — PREDNISONE 20 MG PO TABS
40.0000 mg | ORAL_TABLET | Freq: Every day | ORAL | 0 refills | Status: AC
  Filled 2021-10-01: qty 10, 5d supply, fill #0

## 2021-10-01 NOTE — Progress Notes (Signed)
Chief Complaint  Patient presents with   Cough    congestion   Jonathon Colon here for URI complaints. Due to COVID-19 pandemic, we are interacting via web portal for an electronic face-to-face visit. I verified patient's ID using 2 identifiers. Patient agreed to proceed with visit via this method. Patient is at home, I am at office. Patient, his spouse and I are present for visit.   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  Past Medical History:  Diagnosis Date   Hypertension     Objective No conversational dyspnea Age appropriate judgment and insight Nml affect and mood  No diagnosis found.  Continue to push fluids, practice good hand hygiene, cover mouth when coughing. F/u prn. If starting to experience fevers, shaking, or shortness of breath, seek immediate care. Pt voiced understanding and agreement to the plan.  [place], DO 10/01/21 3:11 PM

## 2021-10-07 ENCOUNTER — Other Ambulatory Visit (HOSPITAL_BASED_OUTPATIENT_CLINIC_OR_DEPARTMENT_OTHER): Payer: Self-pay

## 2021-10-11 ENCOUNTER — Other Ambulatory Visit (HOSPITAL_BASED_OUTPATIENT_CLINIC_OR_DEPARTMENT_OTHER): Payer: Self-pay

## 2021-10-15 ENCOUNTER — Other Ambulatory Visit (HOSPITAL_BASED_OUTPATIENT_CLINIC_OR_DEPARTMENT_OTHER): Payer: Self-pay

## 2021-10-19 ENCOUNTER — Other Ambulatory Visit: Payer: Self-pay | Admitting: Family Medicine

## 2021-10-19 ENCOUNTER — Encounter: Payer: Self-pay | Admitting: Family Medicine

## 2021-10-19 ENCOUNTER — Other Ambulatory Visit (HOSPITAL_BASED_OUTPATIENT_CLINIC_OR_DEPARTMENT_OTHER): Payer: Self-pay

## 2021-10-19 DIAGNOSIS — R6889 Other general symptoms and signs: Secondary | ICD-10-CM

## 2021-10-19 MED ORDER — PROMETHAZINE-CODEINE 6.25-10 MG/5ML PO SYRP
5.0000 mL | ORAL_SOLUTION | Freq: Four times a day (QID) | ORAL | 0 refills | Status: DC | PRN
  Filled 2021-10-19: qty 120, 4d supply, fill #0

## 2021-10-22 ENCOUNTER — Ambulatory Visit (INDEPENDENT_AMBULATORY_CARE_PROVIDER_SITE_OTHER): Payer: 59 | Admitting: Family Medicine

## 2021-10-22 ENCOUNTER — Other Ambulatory Visit (HOSPITAL_BASED_OUTPATIENT_CLINIC_OR_DEPARTMENT_OTHER): Payer: Self-pay

## 2021-10-22 ENCOUNTER — Encounter: Payer: Self-pay | Admitting: Family Medicine

## 2021-10-22 ENCOUNTER — Other Ambulatory Visit: Payer: Self-pay | Admitting: Family Medicine

## 2021-10-22 VITALS — BP 138/86 | HR 88 | Temp 98.0°F | Ht 70.0 in | Wt 279.1 lb

## 2021-10-22 DIAGNOSIS — Z0000 Encounter for general adult medical examination without abnormal findings: Secondary | ICD-10-CM | POA: Diagnosis not present

## 2021-10-22 DIAGNOSIS — Z23 Encounter for immunization: Secondary | ICD-10-CM | POA: Diagnosis not present

## 2021-10-22 DIAGNOSIS — Z1211 Encounter for screening for malignant neoplasm of colon: Secondary | ICD-10-CM

## 2021-10-22 DIAGNOSIS — Z1159 Encounter for screening for other viral diseases: Secondary | ICD-10-CM | POA: Diagnosis not present

## 2021-10-22 MED ORDER — GABAPENTIN 400 MG PO CAPS
ORAL_CAPSULE | Freq: Every day | ORAL | 5 refills | Status: DC
  Filled 2021-10-22: qty 60, 30d supply, fill #0
  Filled 2021-11-29: qty 60, 30d supply, fill #1
  Filled 2021-12-30: qty 60, 30d supply, fill #2
  Filled 2022-01-31: qty 60, 30d supply, fill #3
  Filled 2022-03-03: qty 60, 30d supply, fill #4
  Filled 2022-04-14: qty 60, 30d supply, fill #5

## 2021-10-22 NOTE — Patient Instructions (Addendum)
Give [place] 2-3 business days to get the results of your labs back.   Keep the diet clean and stay active.  The new Shingrix vaccine (for shingles) is a 2 shot series. It can make people feel low energy, achy and almost like they have the flu for 48 hours after injection. Please plan accordingly when deciding on when to get this shot. Call our office for a nurse visit appointment to get this. The second shot of the series is less severe regarding the side effects, but it still lasts 48 hours.   If you do not hear anything about your referral in the next 1-2 weeks, call our office and ask for an update.  Heat (pad or rice pillow in microwave) over affected area, 10-15 minutes twice daily.   I recommend getting the updated bivalent covid vaccination booster at your convenience.   Let us know if you need anything.  Piriformis Syndrome Rehab It is normal to feel mild stretching, pulling, tightness, or discomfort as you do these exercises, but you should stop right away if you feel sudden pain or your pain gets worse.   Stretching and range of motion exercises These exercises warm up your muscles and joints and improve the movement and flexibility of your hip and pelvis. These exercises also help to relieve pain, numbness, and tingling. Exercise A: Hip rotators    Lie on your back on a firm surface. Pull your left / right knee toward your same shoulder with your left / right hand until your knee is pointing toward the ceiling. Hold your left / right ankle with your other hand. Keeping your knee steady, gently pull your left / right ankle toward your other shoulder until you feel a stretch in your buttocks. Hold this position for 30 seconds. Repeat 2 times. Complete this stretch 3 times per week. Exercise B: Hip extensors Lie on your back on a firm surface. Both of your legs should be straight. Pull your left / right knee to your chest. Hold your leg in this position by holding onto the back of  your thigh or the front of your knee. Hold this position for 30 seconds. Slowly return to the starting position. Repeat 2 times. Complete this stretch 3 times per week.  Strengthening exercises These exercises build strength and endurance in your hip and thigh muscles. Endurance is the ability to use your muscles for a long time, even after they get tired. Exercise C: Straight leg raises (hip abductors)     Lie on your side with your left / right leg in the top position. Lie so your head, shoulder, knee, and hip line up. Bend your bottom knee to help you balance. Lift your top leg up 4-6 inches (10-15 cm), keeping your toes pointed straight ahead. Hold this position for 1 second. Slowly lower your leg to the starting position. Let your muscles relax completely. Repeat for a total of 10 repetitions. Repeat 2 times. Complete this exercise 3 times per week. Exercise D: Hip abductors and rotators, quadruped    Get on your hands and knees on a firm, lightly padded surface. Your hands should be directly below your shoulders, and your knees should be directly below your hips. Lift your left / right knee out to the side. Keep your knee bent. Do not twist your body. Hold this position for 1 seconds. Slowly lower your leg. Repeat for a total of 10 repetitions.  Repeat 1 times. Complete this exercise 3 times per  week. Exercise E: Straight leg raises (hip extensors) Lie on your abdomen on a bed or a firm surface with a pillow under your hips. Squeeze your buttock muscles and lift your left / right thigh off the bed. Do not let your back arch. Hold this position for 3 seconds. Slowly return to the starting position. Let your muscles relax completely before doing another repetition. Repeat 2 times. Complete this exercise 3 times per week.  This information is not intended to replace advice given to you by your health care provider. Make sure you discuss any questions you have with your health care  provider. Document Released: 11/07/2005 Document Revised: 07/12/2016 Document Reviewed: 10/20/2015 Elsevier Interactive Patient Education  [employer].

## 2021-10-22 NOTE — Progress Notes (Signed)
Chief Complaint  Patient presents with   Annual Exam    Well Male Jonathon Colon is here for a complete physical.   His last physical was >1 year ago.  Current diet: in general, diet could be better.  Current exercise: active at work Weight trend: stable Fatigue out of ordinary? No. Seat belt? Yes.    Health maintenance Shingrix- No Colonoscopy- No Tetanus- Yes HIV- Yes Hep C- Yes   Past Medical History:  Diagnosis Date   Hypertension     Past Surgical History:  Procedure Laterality Date   ROTATOR CUFF REPAIR Right 2019   TONSILLECTOMY  1974    Medications  Current Outpatient Medications on File Prior to Visit  Medication Sig Dispense Refill   gabapentin (NEURONTIN) 400 MG capsule TAKE 2 CAPSULES BY MOUTH AT BEDTIME 60 capsule 5   montelukast (SINGULAIR) 10 MG tablet Take 1 tablet (10 mg total) by mouth at bedtime. 30 tablet 3   Suvorexant (BELSOMRA) 20 MG TABS Take 1 tablet (20 mg) by mouth at bedtime. 30 tablet 2   traZODone (DESYREL) 150 MG tablet Take 1 tablet (150 mg total) by mouth at bedtime. 30 tablet 1    Allergies No Known Allergies  Family History Family History  Problem Relation Age of Onset   COPD Mother    Heart failure Father    Hypertension Father    Hyperlipidemia Father    Hypertension Sister    Diabetes Sister     Review of Systems: Constitutional:  no fevers Eye:  no recent significant change in vision Ear/Nose/Mouth/Throat:  Ears:  no hearing loss Nose/Mouth/Throat:  no complaints of nasal congestion, no sore throat Cardiovascular:  no chest pain Respiratory:  no shortness of breath Gastrointestinal:  no change in bowel habits GU:  Male: negative for dysuria, frequency Musculoskeletal/Extremities:  +hip pain on R Integumentary (Skin/Breast):  no abnormal skin lesions reported Neurologic:  no headaches Endocrine: No unexpected weight changes Hematologic/Lymphatic:  no abnormal bleeding  Exam BP 138/86   Pulse 88   Temp 98 F  (36.7 C) (Oral)   Ht 5\' 10"  (1.778 m)   Wt 279 lb 2 oz (126.6 kg)   SpO2 97%   BMI 40.05 kg/m  General:  well developed, well nourished, in no apparent distress Skin:  no significant moles, warts, or growths Head:  no masses, lesions, or tenderness Eyes:  pupils equal and round, sclera anicteric without injection Ears:  canals without lesions, TMs shiny without retraction, no obvious effusion, no erythema Nose:  nares patent, septum midline, mucosa normal Throat/Pharynx:  lips and gingiva without lesion; tongue and uvula midline; non-inflamed pharynx; no exudates or postnasal drainage Neck: neck supple without adenopathy, thyromegaly, or masses Cardiac: RRR, no bruits, no LE edema Lungs:  clear to auscultation, breath sounds equal bilaterally, no respiratory distress Abdomen: BS+, soft, non-tender, non-distended, no masses or organomegaly noted Rectal: Deferred Musculoskeletal:  symmetrical muscle groups noted without atrophy or deformity Neuro:  gait normal; deep tendon reflexes normal and symmetric Psych: well oriented with normal range of affect and appropriate judgment/insight  Assessment and Plan  Well adult exam - Plan: CBC, Comprehensive metabolic panel, Lipid panel  Encounter for hepatitis C screening test for low risk patient - Plan: Hepatitis C antibody  Screen for colon cancer   Well 50 y.o. male. Counseled on diet and exercise. Counseled on risks and benefits of prostate cancer screening with PSA. The patient agrees to forego testing. Immunizations, labs, and further orders as above. Shingrix #  1 today, 2nd in 2 mo. Bivalent covid booster rec'd.  Hep C screening today. Cologard to be ordered. Declined colonoscopy.  Stretches for buttock pain.  Follow up in 6 mo. The patient voiced understanding and agreement to the plan.  [place], DO 10/22/21 3:26 PM

## 2021-10-23 LAB — COMPREHENSIVE METABOLIC PANEL
AG Ratio: 1.4 (calc) (ref 1.0–2.5)
ALT: 55 U/L — ABNORMAL HIGH (ref 9–46)
AST: 40 U/L — ABNORMAL HIGH (ref 10–35)
Albumin: 4 g/dL (ref 3.6–5.1)
Alkaline phosphatase (APISO): 76 U/L (ref 35–144)
BUN: 8 mg/dL (ref 7–25)
CO2: 28 mmol/L (ref 20–32)
Calcium: 9.3 mg/dL (ref 8.6–10.3)
Chloride: 103 mmol/L (ref 98–110)
Creat: 0.79 mg/dL (ref 0.70–1.30)
Globulin: 2.8 g/dL (calc) (ref 1.9–3.7)
Glucose, Bld: 121 mg/dL — ABNORMAL HIGH (ref 65–99)
Potassium: 4.5 mmol/L (ref 3.5–5.3)
Sodium: 139 mmol/L (ref 135–146)
Total Bilirubin: 0.9 mg/dL (ref 0.2–1.2)
Total Protein: 6.8 g/dL (ref 6.1–8.1)

## 2021-10-23 LAB — LIPID PANEL
Cholesterol: 127 mg/dL (ref <200)
HDL: 32 mg/dL — ABNORMAL LOW (ref >=40)
LDL Cholesterol (Calc): 69 mg/dL (calc)
Non-HDL Cholesterol (Calc): 95 mg/dL (calc) (ref <130)
Total CHOL/HDL Ratio: 4 (calc) (ref <5.0)
Triglycerides: 181 mg/dL — ABNORMAL HIGH (ref <150)

## 2021-10-23 LAB — CBC
HCT: 43.2 % (ref 38.5–50.0)
Hemoglobin: 14.5 g/dL (ref 13.2–17.1)
MCH: 30.7 pg (ref 27.0–33.0)
MCHC: 33.6 g/dL (ref 32.0–36.0)
MCV: 91.3 fL (ref 80.0–100.0)
MPV: 9.8 fL (ref 7.5–12.5)
Platelets: 272 10*3/uL (ref 140–400)
RBC: 4.73 10*6/uL (ref 4.20–5.80)
RDW: 13.1 % (ref 11.0–15.0)
WBC: 8 10*3/uL (ref 3.8–10.8)

## 2021-10-25 ENCOUNTER — Other Ambulatory Visit: Payer: Self-pay | Admitting: Family Medicine

## 2021-10-25 DIAGNOSIS — R945 Abnormal results of liver function studies: Secondary | ICD-10-CM

## 2021-10-25 DIAGNOSIS — E785 Hyperlipidemia, unspecified: Secondary | ICD-10-CM

## 2021-10-25 LAB — HEPATITIS C ANTIBODY
Hepatitis C Ab: NONREACTIVE
SIGNAL TO CUT-OFF: 0.04 (ref <1.00)

## 2021-11-15 ENCOUNTER — Other Ambulatory Visit: Payer: Self-pay | Admitting: Family Medicine

## 2021-11-15 DIAGNOSIS — G4720 Circadian rhythm sleep disorder, unspecified type: Secondary | ICD-10-CM

## 2021-11-15 DIAGNOSIS — G4700 Insomnia, unspecified: Secondary | ICD-10-CM

## 2021-11-16 ENCOUNTER — Other Ambulatory Visit (HOSPITAL_BASED_OUTPATIENT_CLINIC_OR_DEPARTMENT_OTHER): Payer: Self-pay

## 2021-11-16 MED ORDER — TRAZODONE HCL 150 MG PO TABS
150.0000 mg | ORAL_TABLET | Freq: Every day | ORAL | 1 refills | Status: DC
  Filled 2021-11-16: qty 30, 30d supply, fill #0
  Filled 2021-12-14: qty 30, 30d supply, fill #1

## 2021-11-17 ENCOUNTER — Other Ambulatory Visit: Payer: Self-pay | Admitting: Family Medicine

## 2021-11-17 ENCOUNTER — Other Ambulatory Visit (HOSPITAL_BASED_OUTPATIENT_CLINIC_OR_DEPARTMENT_OTHER): Payer: Self-pay

## 2021-11-17 ENCOUNTER — Encounter: Payer: Self-pay | Admitting: Family Medicine

## 2021-11-17 DIAGNOSIS — R6889 Other general symptoms and signs: Secondary | ICD-10-CM

## 2021-11-17 DIAGNOSIS — R059 Cough, unspecified: Secondary | ICD-10-CM

## 2021-11-17 MED ORDER — PROMETHAZINE-CODEINE 6.25-10 MG/5ML PO SYRP
5.0000 mL | ORAL_SOLUTION | Freq: Four times a day (QID) | ORAL | 0 refills | Status: DC | PRN
  Filled 2021-11-17: qty 120, 4d supply, fill #0

## 2021-11-17 MED ORDER — BENZONATATE 100 MG PO CAPS
100.0000 mg | ORAL_CAPSULE | Freq: Three times a day (TID) | ORAL | 0 refills | Status: DC | PRN
  Filled 2021-11-17: qty 30, 10d supply, fill #0

## 2021-11-19 ENCOUNTER — Other Ambulatory Visit: Payer: Self-pay | Admitting: Family Medicine

## 2021-11-19 ENCOUNTER — Other Ambulatory Visit (HOSPITAL_BASED_OUTPATIENT_CLINIC_OR_DEPARTMENT_OTHER): Payer: Self-pay

## 2021-11-19 MED ORDER — MOLNUPIRAVIR EUA 200MG CAPSULE
4.0000 | ORAL_CAPSULE | Freq: Two times a day (BID) | ORAL | 0 refills | Status: AC
  Filled 2021-11-19: qty 40, 5d supply, fill #0

## 2021-11-29 ENCOUNTER — Other Ambulatory Visit (HOSPITAL_BASED_OUTPATIENT_CLINIC_OR_DEPARTMENT_OTHER): Payer: Self-pay

## 2021-12-14 ENCOUNTER — Other Ambulatory Visit (HOSPITAL_BASED_OUTPATIENT_CLINIC_OR_DEPARTMENT_OTHER): Payer: Self-pay

## 2021-12-23 ENCOUNTER — Ambulatory Visit: Payer: 59

## 2021-12-30 ENCOUNTER — Other Ambulatory Visit: Payer: Self-pay | Admitting: Family Medicine

## 2021-12-30 DIAGNOSIS — G4700 Insomnia, unspecified: Secondary | ICD-10-CM

## 2021-12-30 DIAGNOSIS — G4720 Circadian rhythm sleep disorder, unspecified type: Secondary | ICD-10-CM

## 2021-12-31 ENCOUNTER — Other Ambulatory Visit (HOSPITAL_BASED_OUTPATIENT_CLINIC_OR_DEPARTMENT_OTHER): Payer: Self-pay

## 2021-12-31 MED ORDER — BELSOMRA 20 MG PO TABS
20.0000 mg | ORAL_TABLET | Freq: Every evening | ORAL | 5 refills | Status: DC
  Filled 2021-12-31: qty 30, 30d supply, fill #0
  Filled 2022-01-31: qty 30, 30d supply, fill #1
  Filled 2022-03-03: qty 30, 30d supply, fill #2
  Filled 2022-04-04: qty 30, 30d supply, fill #3
  Filled 2022-04-30: qty 30, 30d supply, fill #4
  Filled 2022-05-31: qty 30, 30d supply, fill #5

## 2022-01-11 ENCOUNTER — Other Ambulatory Visit: Payer: Self-pay | Admitting: Family Medicine

## 2022-01-11 ENCOUNTER — Other Ambulatory Visit (HOSPITAL_BASED_OUTPATIENT_CLINIC_OR_DEPARTMENT_OTHER): Payer: Self-pay

## 2022-01-11 DIAGNOSIS — R0981 Nasal congestion: Secondary | ICD-10-CM

## 2022-01-11 DIAGNOSIS — G4700 Insomnia, unspecified: Secondary | ICD-10-CM

## 2022-01-11 DIAGNOSIS — G4720 Circadian rhythm sleep disorder, unspecified type: Secondary | ICD-10-CM

## 2022-01-11 MED ORDER — TRAZODONE HCL 150 MG PO TABS
150.0000 mg | ORAL_TABLET | Freq: Every day | ORAL | 2 refills | Status: DC
  Filled 2022-01-11: qty 90, 90d supply, fill #0
  Filled 2022-04-04: qty 90, 90d supply, fill #1
  Filled 2022-06-27: qty 90, 90d supply, fill #2

## 2022-01-11 MED ORDER — MONTELUKAST SODIUM 10 MG PO TABS
10.0000 mg | ORAL_TABLET | Freq: Every day | ORAL | 3 refills | Status: DC
  Filled 2022-01-11: qty 30, 30d supply, fill #0
  Filled 2022-02-15: qty 30, 30d supply, fill #1
  Filled 2022-03-18: qty 30, 30d supply, fill #2
  Filled 2022-04-19: qty 30, 30d supply, fill #3

## 2022-01-11 NOTE — Telephone Encounter (Signed)
Last OV--10/22/21 Last RF--11/16/21--#30 no refills

## 2022-01-31 ENCOUNTER — Other Ambulatory Visit (HOSPITAL_BASED_OUTPATIENT_CLINIC_OR_DEPARTMENT_OTHER): Payer: Self-pay

## 2022-02-15 ENCOUNTER — Other Ambulatory Visit (HOSPITAL_BASED_OUTPATIENT_CLINIC_OR_DEPARTMENT_OTHER): Payer: Self-pay

## 2022-03-03 ENCOUNTER — Other Ambulatory Visit (HOSPITAL_BASED_OUTPATIENT_CLINIC_OR_DEPARTMENT_OTHER): Payer: Self-pay

## 2022-03-18 ENCOUNTER — Other Ambulatory Visit (HOSPITAL_BASED_OUTPATIENT_CLINIC_OR_DEPARTMENT_OTHER): Payer: Self-pay

## 2022-04-04 ENCOUNTER — Other Ambulatory Visit (HOSPITAL_BASED_OUTPATIENT_CLINIC_OR_DEPARTMENT_OTHER): Payer: Self-pay

## 2022-04-14 ENCOUNTER — Other Ambulatory Visit (HOSPITAL_BASED_OUTPATIENT_CLINIC_OR_DEPARTMENT_OTHER): Payer: Self-pay

## 2022-04-19 ENCOUNTER — Other Ambulatory Visit (HOSPITAL_BASED_OUTPATIENT_CLINIC_OR_DEPARTMENT_OTHER): Payer: Self-pay

## 2022-04-21 ENCOUNTER — Encounter: Payer: Self-pay | Admitting: *Deleted

## 2022-04-22 ENCOUNTER — Ambulatory Visit: Payer: 59 | Admitting: Family Medicine

## 2022-04-30 ENCOUNTER — Emergency Department (HOSPITAL_BASED_OUTPATIENT_CLINIC_OR_DEPARTMENT_OTHER): Payer: 59

## 2022-04-30 ENCOUNTER — Other Ambulatory Visit: Payer: Self-pay

## 2022-04-30 ENCOUNTER — Ambulatory Visit (HOSPITAL_BASED_OUTPATIENT_CLINIC_OR_DEPARTMENT_OTHER)
Admission: EM | Admit: 2022-04-30 | Discharge: 2022-04-30 | Disposition: A | Discharge status: Home / Self Care | Payer: 59 | Attending: Emergency Medicine | Admitting: Emergency Medicine

## 2022-04-30 ENCOUNTER — Emergency Department (HOSPITAL_COMMUNITY): Payer: 59 | Admitting: Certified Registered"

## 2022-04-30 ENCOUNTER — Encounter (HOSPITAL_COMMUNITY)
Admission: EM | Disposition: A | Discharge status: Home / Self Care | Payer: Self-pay | Source: Home / Self Care | Attending: Emergency Medicine

## 2022-04-30 ENCOUNTER — Emergency Department (EMERGENCY_DEPARTMENT_HOSPITAL): Payer: 59 | Admitting: Certified Registered"

## 2022-04-30 ENCOUNTER — Encounter (HOSPITAL_BASED_OUTPATIENT_CLINIC_OR_DEPARTMENT_OTHER): Payer: Self-pay | Admitting: Emergency Medicine

## 2022-04-30 DIAGNOSIS — T189XXA Foreign body of alimentary tract, part unspecified, initial encounter: Secondary | ICD-10-CM

## 2022-04-30 DIAGNOSIS — T18108A Unspecified foreign body in esophagus causing other injury, initial encounter: Secondary | ICD-10-CM

## 2022-04-30 DIAGNOSIS — R1310 Dysphagia, unspecified: Secondary | ICD-10-CM | POA: Insufficient documentation

## 2022-04-30 DIAGNOSIS — T18128A Food in esophagus causing other injury, initial encounter: Secondary | ICD-10-CM | POA: Diagnosis not present

## 2022-04-30 DIAGNOSIS — X58XXXA Exposure to other specified factors, initial encounter: Secondary | ICD-10-CM | POA: Diagnosis not present

## 2022-04-30 DIAGNOSIS — G4730 Sleep apnea, unspecified: Secondary | ICD-10-CM | POA: Insufficient documentation

## 2022-04-30 HISTORY — PX: ESOPHAGOGASTRODUODENOSCOPY: SHX5428

## 2022-04-30 SURGERY — EGD (ESOPHAGOGASTRODUODENOSCOPY)
Anesthesia: General

## 2022-04-30 MED ORDER — OXYCODONE HCL 5 MG/5ML PO SOLN: 5.0000 mg | Freq: Once | ORAL | Status: DC | PRN

## 2022-04-30 MED ORDER — LACTATED RINGERS IV SOLN
INTRAVENOUS | Status: AC | PRN
  Administered 2022-04-30: 1000 mL via INTRAVENOUS

## 2022-04-30 MED ORDER — AMISULPRIDE (ANTIEMETIC) 5 MG/2ML IV SOLN: 10.0000 mg | Freq: Once | INTRAVENOUS | Status: DC | PRN

## 2022-04-30 MED ORDER — LIDOCAINE 2% (20 MG/ML) 5 ML SYRINGE
INTRAMUSCULAR | Status: DC | PRN
  Administered 2022-04-30: 100 mg via INTRAVENOUS

## 2022-04-30 MED ORDER — ONDANSETRON HCL 4 MG/2ML IJ SOLN: 4.0000 mg | Freq: Once | INTRAMUSCULAR | Status: DC | PRN

## 2022-04-30 MED ORDER — ONDANSETRON HCL 4 MG/2ML IJ SOLN
INTRAMUSCULAR | Status: DC | PRN
  Administered 2022-04-30: 4 mg via INTRAVENOUS

## 2022-04-30 MED ORDER — DEXAMETHASONE SODIUM PHOSPHATE 10 MG/ML IJ SOLN
INTRAMUSCULAR | Status: DC | PRN
  Administered 2022-04-30: 4 mg via INTRAVENOUS

## 2022-04-30 MED ORDER — FENTANYL CITRATE (PF) 100 MCG/2ML IJ SOLN
INTRAMUSCULAR | Status: DC | PRN
  Administered 2022-04-30: 50 ug via INTRAVENOUS

## 2022-04-30 MED ORDER — OXYCODONE HCL 5 MG PO TABS: 5.0000 mg | ORAL_TABLET | Freq: Once | ORAL | Status: DC | PRN

## 2022-04-30 MED ORDER — GLUCAGON HCL RDNA (DIAGNOSTIC) 1 MG IJ SOLR
1.0000 mg | Freq: Once | INTRAMUSCULAR | Status: AC
  Administered 2022-04-30: 1 mg via INTRAVENOUS
  Filled 2022-04-30: qty 1

## 2022-04-30 MED ORDER — SODIUM CHLORIDE 0.9 % IV SOLN: INTRAVENOUS | Status: DC

## 2022-04-30 MED ORDER — FENTANYL CITRATE (PF) 100 MCG/2ML IJ SOLN: 25.0000 ug | INTRAMUSCULAR | Status: DC | PRN

## 2022-04-30 MED ORDER — SUCCINYLCHOLINE CHLORIDE 200 MG/10ML IV SOSY
PREFILLED_SYRINGE | INTRAVENOUS | Status: DC | PRN
  Administered 2022-04-30: 100 mg via INTRAVENOUS

## 2022-04-30 MED ORDER — PROPOFOL 10 MG/ML IV BOLUS
INTRAVENOUS | Status: DC | PRN
  Administered 2022-04-30: 200 mg via INTRAVENOUS

## 2022-04-30 MED ORDER — ROCURONIUM BROMIDE 10 MG/ML (PF) SYRINGE
PREFILLED_SYRINGE | INTRAVENOUS | Status: DC | PRN
  Administered 2022-04-30: 5 mg via INTRAVENOUS

## 2022-04-30 NOTE — Transfer of Care (Signed)
Immediate Anesthesia Transfer of Care Note  Patient: Jonathon Colon  Procedure(s) Performed: ESOPHAGOGASTRODUODENOSCOPY (EGD)  Patient Location: PACU  Anesthesia Type:General  Level of Consciousness: awake, alert , oriented and patient cooperative  Airway & Oxygen Therapy: Patient Spontanous Breathing and Patient connected to face mask oxygen  Post-op Assessment: Report given to RN and Post -op Vital signs reviewed and stable  Post vital signs: Reviewed and stable  Last Vitals:  Vitals Value Taken Time  BP 151/93 04/30/22 2221  Temp    Pulse 83 04/30/22 2223  Resp 10 04/30/22 2223  SpO2 98 % 04/30/22 2223  Vitals shown include unvalidated device data.  Last Pain:  Vitals:   04/30/22 2134  TempSrc: Oral  PainSc:          Complications: No notable events documented.

## 2022-04-30 NOTE — Op Note (Signed)
[facility] Patient Name: Jonathon Colon Procedure Date: 04/30/2022 MRN: [id number] Attending MD: Clarene Essex , MD Date of Birth: 10/31/1971 CSN: [id number] Age: 51 Admit Type: Outpatient Procedure:                Upper GI endoscopy Indications:              Odynophagia, Foreign body in the esophagus Providers:                Clarene Essex, MD, Elmer Ramp. Tilden Dome, RN, Engineer, civil (consulting), Merchant navy officer Referring MD:              Medicines:                General Anesthesia Complications:            No immediate complications. Estimated Blood Loss:     Estimated blood loss: none. Procedure:                Pre-Anesthesia Assessment:                           - Prior to the procedure, a History and Physical                            was performed, and patient medications and                            allergies were reviewed. The patient's tolerance of                            previous anesthesia was also reviewed. The risks                            and benefits of the procedure and the sedation                            options and risks were discussed with the patient.                            All questions were answered, and informed consent                            was obtained. Prior Anticoagulants: The patient has                            taken no previous anticoagulant or antiplatelet                            agents. ASA Grade Assessment: II - A patient with                            mild systemic disease. After reviewing the risks  and benefits, the patient was deemed in                            satisfactory condition to undergo the procedure.                           After obtaining informed consent, the endoscope was                            passed under direct vision. Throughout the                            procedure, the patient's blood pressure, pulse, and                            oxygen  saturations were monitored continuously. The                            [id number]-[id number] ([id number]) Olympus endoscope was introduced                            through the mouth, and advanced to the duodenal                            bulb. The upper GI endoscopy was accomplished                            without difficulty. The patient tolerated the                            procedure well. Scope In: Scope Out: Findings:      The examined esophagus was normal.      A medium amount of food (residue) was found in the cardia, in the       gastric fundus, in the gastric body and in the gastric antrum.      The duodenal bulb was normal.      The exam was otherwise without abnormality. Impression:               - Normal esophagus.                           - A medium amount of food (residue) in the stomach.                           - Normal duodenal bulb.                           - The examination was otherwise normal.                           - No specimens collected. Moderate Sedation:      Not Applicable - Patient had care per Anesthesia. Recommendation:           - Patient has a contact number available for  emergencies. The signs and symptoms of potential                            delayed complications were discussed with the                            patient. Return to normal activities tomorrow.                            Written discharge instructions were provided to the                            patient.                           - Clear liquid diet today. May slowly advance to                            soft solids when able                           - Continue present medications.                           - Return to GI clinic PRN. Please call if you would                            like to schedule your screening colonoscopy                           - Telephone GI clinic if symptomatic PRN. Procedure Code(s):        --- Professional ---                            [phone], Esophagogastroduodenoscopy, flexible,                            transoral; diagnostic, including collection of                            specimen(s) by brushing or washing, when performed                            (separate procedure) Diagnosis Code(s):        --- Professional ---                           R13.10, Dysphagia, unspecified                           T18.108A, Unspecified foreign body in esophagus                            causing other injury, initial encounter CPT copyright 2019 American Medical Association. All rights reserved. The codes documented in this report are preliminary and upon coder review may  be  revised to meet current compliance requirements. Clarene Essex, MD 04/30/2022 10:16:20 PM This report has been signed electronically. Number of Addenda: 0

## 2022-04-30 NOTE — ED Provider Notes (Addendum)
[facility] Provider Note   CSN: [id number] Arrival date & time: 04/30/22  1850     History  Chief Complaint  Patient presents with   Foreign Body    Jonathon Colon is a 51 y.o. male present emerged department complaint of swallowed a chicken wing.  The patient reports this occurred around 530 this evening, he was eating a takeout chicken meal, and feels that a piece of chicken bone got stuck in his mid esophagus, at approximately the level of the larynx.  He gagged and tried to vomit afterwards.  He is able to speak, swallow fluids.  He has not had any drooling.  He denies prior history of dysphagia and does not have a GI doctor  HPI     Home Medications Prior to Admission medications   Medication Sig Start Date End Date Taking? Authorizing Provider  benzonatate (TESSALON PERLES) 100 MG capsule Take 1 capsule (100 mg total) by mouth 3 (three) times daily as needed. 11/17/21   Shelda Pal, DO  gabapentin (NEURONTIN) 400 MG capsule TAKE 2 CAPSULES BY MOUTH AT BEDTIME 10/22/21 10/22/22  Wendling, Crosby Oyster, DO  montelukast (SINGULAIR) 10 MG tablet Take 1 tablet (10 mg total) by mouth at bedtime. 01/11/22   Shelda Pal, DO  promethazine-codeine (PHENERGAN WITH CODEINE) 6.25-10 MG/5ML syrup Take 5-10 mLs by mouth every 6 (six) hours as needed for cough. 11/17/21   Wendling, Crosby Oyster, DO  Suvorexant (BELSOMRA) 20 MG TABS Take 1 tablet (20 mg) by mouth at bedtime. 12/31/21   Shelda Pal, DO  traZODone (DESYREL) 150 MG tablet Take 1 tablet (150 mg total) by mouth at bedtime. 01/11/22   Shelda Pal, DO      Allergies    Patient has no known allergies.    Review of Systems   Review of Systems  Physical Exam Updated Vital Signs BP (!) 158/92 (BP Location: Right Arm)   Pulse (!) 103   Temp 97.7 F (36.5 C) (Oral)   Resp 20   Ht '5\' 10"'$  (1.778 m)   Wt 120.2 kg   SpO2 95%   BMI 38.02 kg/m  Physical  Exam Constitutional:      General: He is not in acute distress.    Comments: Voice is mildly muffled, no drooling  HENT:     Head: Normocephalic and atraumatic.  Eyes:     Conjunctiva/sclera: Conjunctivae normal.     Pupils: Pupils are equal, round, and reactive to light.  Cardiovascular:     Rate and Rhythm: Normal rate and regular rhythm.  Pulmonary:     Effort: Pulmonary effort is normal. No respiratory distress.  Musculoskeletal:     Cervical back: Normal range of motion and neck supple. No rigidity.  Skin:    General: Skin is warm and dry.  Neurological:     General: No focal deficit present.     Mental Status: He is alert. Mental status is at baseline.     ED Results / Procedures / Treatments   Labs (all labs ordered are listed, but only abnormal results are displayed) Labs Reviewed - No data to display  EKG None  Radiology DG Neck Soft Tissue  Result Date: 04/30/2022 CLINICAL DATA:  reports swallowed chicken bone feels stuck at level of larynx EXAM: NECK SOFT TISSUES - 1+ VIEW COMPARISON:  None Available. FINDINGS: There is no evidence of retropharyngeal soft tissue swelling or epiglottic enlargement. The cervical airway is unremarkable and no radio-opaque  foreign body identified. IMPRESSION: Negative. Electronically Signed   By: Rolm Baptise M.D.   On: 04/30/2022 19:27    Procedures Procedures    Medications Ordered in ED Medications  glucagon (human recombinant) (GLUCAGEN) injection 1 mg (1 mg Intravenous Given 04/30/22 1937)    ED Course/ Medical Decision Making/ A&P Clinical Course as of 04/30/22 2032  Sat Apr 30, 2022  2029 Patient will go POV with wife driving to [facility] ED for evaluation by Dr Watt Climes GI for possible endoscopy [MT]  2032 Dr Francia Greaves accepting EDP [MT]    Clinical Course User Index [MT] Langston Masker Carola Rhine, MD                           Medical Decision Making Amount and/or Complexity of Data Reviewed Radiology:  ordered.  Risk Prescription drug management.   Patient is here with a swallowed foreign body, reporting a chicken bone lodged at approximately the level of the larynx.  I do not see any foreign body in the posterior oropharynx.  X-rays ordered of the neck and reviewed, showing no visible foreign body  I do not see evidence of airway failure, impending respiratory distress, or perforation at this time.  He appears comfortable in the room.  IV glucagon given.  However on reassessment after over an hour the patient feels that there is still sensation of something scratching or stuck in his mid esophagus.  I spoke to Dr. Watt Climes from GI, who advised to send the patient as soon as possible to the [facility] ER for evaluation.  Please contact [place] [facility] upon the patient's arrival        Final Clinical Impression(s) / ED Diagnoses Final diagnoses:  Swallowed foreign body, initial encounter    Rx / DC Orders ED Discharge Orders     None         Hinata Diener, Carola Rhine, MD 04/30/22 2032    Wyvonnia Dusky, MD 04/30/22 2032

## 2022-04-30 NOTE — ED Triage Notes (Signed)
Pt reports he thinks a chicken bone is stuck in throat

## 2022-04-30 NOTE — Anesthesia Preprocedure Evaluation (Signed)
Anesthesia Evaluation  Patient identified by MRN, date of birth, ID band Patient awake    Reviewed: Allergy & Precautions, NPO status , Patient's Chart, lab work & pertinent test results  History of Anesthesia Complications Negative for: history of anesthetic complications  Airway Mallampati: II  TM Distance: >3 FB Neck ROM: Full    Dental  (+) Teeth Intact, Dental Advisory Given   Pulmonary sleep apnea ,    Pulmonary exam normal        Cardiovascular hypertension, Normal cardiovascular exam     Neuro/Psych negative neurological ROS     GI/Hepatic negative GI ROS, Neg liver ROS,   Endo/Other  negative endocrine ROS  Renal/GU negative Renal ROS  negative genitourinary   Musculoskeletal negative musculoskeletal ROS (+)   Abdominal   Peds  Hematology negative hematology ROS (+)   Anesthesia Other Findings   Reproductive/Obstetrics                             Anesthesia Physical Anesthesia Plan  ASA: 2  Anesthesia Plan: General   Post-op Pain Management:    Induction: Intravenous and Rapid sequence  PONV Risk Score and Plan: 2 and Ondansetron, Dexamethasone, Treatment may vary due to age or medical condition and Midazolam  Airway Management Planned: Oral ETT  Additional Equipment: None  Intra-op Plan:   Post-operative Plan: Extubation in OR  Informed Consent: I have reviewed the patients History and Physical, chart, labs and discussed the procedure including the risks, benefits and alternatives for the proposed anesthesia with the patient or authorized representative who has indicated his/her understanding and acceptance.     Dental advisory given  Plan Discussed with:   Anesthesia Plan Comments:         Anesthesia Quick Evaluation

## 2022-04-30 NOTE — ED Notes (Signed)
Patient's IV secured for transfer POV per MD Trifan.  Patient educated to go straight to [facility] and check in the ED there for further evaluation.

## 2022-04-30 NOTE — Consult Note (Signed)
Reason for Consult: Concerns over foreign body in esophagus Referring Physician: ER physician  Jonathon Colon is an 51 y.o. male.  HPI: Patient seen and examined in hospital computer chart reviewed and case discussed with the ER physician and he has not had any previous GI problems or any previous GI work-up and tonight while eating chicken felt like a bone was caught and was able to drink water but still has a pain in the moving sensation whenever he swallows and he has not had any previous issues and no other complaint  Past Medical History:  Diagnosis Date   Hypertension     Past Surgical History:  Procedure Laterality Date   KNEE SURGERY Left    ROTATOR CUFF REPAIR Right 2019   TONSILLECTOMY  1974    Family History  Problem Relation Age of Onset   COPD Mother    Heart failure Father    Hypertension Father    Hyperlipidemia Father    Hypertension Sister    Diabetes Sister     Social History:  reports that he has never smoked. He has never used smokeless tobacco. He reports that he does not use drugs. No history on file for alcohol use.  Allergies: No Known Allergies  Medications: I have reviewed the patient's current medications.  No results found for this or any previous visit (from the past 48 hour(s)).  DG Neck Soft Tissue  Result Date: 04/30/2022 CLINICAL DATA:  reports swallowed chicken bone feels stuck at level of larynx EXAM: NECK SOFT TISSUES - 1+ VIEW COMPARISON:  None Available. FINDINGS: There is no evidence of retropharyngeal soft tissue swelling or epiglottic enlargement. The cervical airway is unremarkable and no radio-opaque foreign body identified. IMPRESSION: Negative. Electronically Signed   By: Rolm Baptise M.D.   On: 04/30/2022 19:27    Review of Systems negative except above Blood pressure (!) 154/90, pulse 92, temperature 97.6 F (36.4 C), temperature source Oral, resp. rate 16, height '5\' 10"'$  (1.778 m), weight 120.2 kg, SpO2 96 %. Physical Exam vital  signs stable afebrile no acute distress exam please see preassessment evaluation x-ray negative  Assessment/Plan: Concerns over bone  in Proximal esophagus Plan: The risk benefits methods of endoscopy was discussed and will proceed with anesthesia's assistance this evening Jaclyn Carew E 04/30/2022, 9:31 PM

## 2022-04-30 NOTE — Anesthesia Postprocedure Evaluation (Signed)
Anesthesia Post Note  Patient: Geophysical data processor  Procedure(s) Performed: ESOPHAGOGASTRODUODENOSCOPY (EGD)     Patient location during evaluation: PACU Anesthesia Type: General Level of consciousness: awake and alert Pain management: pain level controlled Vital Signs Assessment: post-procedure vital signs reviewed and stable Respiratory status: spontaneous breathing, nonlabored ventilation and respiratory function stable Cardiovascular status: blood pressure returned to baseline and stable Postop Assessment: no apparent nausea or vomiting Anesthetic complications: no   No notable events documented.  Last Vitals:  Vitals:   04/30/22 2031 04/30/22 2134  BP: (!) 154/90 (!) 169/105  Pulse: 92 96  Resp: 16 16  Temp: 36.4 C 37.2 C  SpO2: 96% 97%    Last Pain:  Vitals:   04/30/22 2134  TempSrc: Oral  PainSc:                  Lidia Collum

## 2022-04-30 NOTE — ED Notes (Signed)
Patient to imaging.

## 2022-04-30 NOTE — Anesthesia Procedure Notes (Signed)
Procedure Name: Intubation Date/Time: 04/30/2022 9:52 PM  Performed by: Cleda Daub, CRNAPre-anesthesia Checklist: Patient identified, Emergency Drugs available, Suction available and Patient being monitored Patient Re-evaluated:Patient Re-evaluated prior to induction Oxygen Delivery Method: Circle system utilized Preoxygenation: Pre-oxygenation with 100% oxygen Induction Type: IV induction Ventilation: Mask ventilation without difficulty Laryngoscope Size: Glidescope and 4 Grade View: Grade I Tube type: Oral Tube size: 7.5 mm Number of attempts: 1 Airway Equipment and Method: Stylet and Oral airway Placement Confirmation: ETT inserted through vocal cords under direct vision, positive ETCO2 and breath sounds checked- equal and bilateral Secured at: 23 cm Tube secured with: Tape Dental Injury: Teeth and Oropharynx as per pre-operative assessment

## 2022-04-30 NOTE — Discharge Instructions (Signed)
Begin with clear liquid and slowly advance to soft solids and no driving tonight and call if question or problem or if symptoms worsen otherwise expect symptoms to resolve in a few days and follow-up with me if any GI question or problem and call when you would like to schedule a screening colonoscopy

## 2022-05-02 ENCOUNTER — Other Ambulatory Visit (HOSPITAL_BASED_OUTPATIENT_CLINIC_OR_DEPARTMENT_OTHER): Payer: Self-pay

## 2022-05-02 ENCOUNTER — Encounter (HOSPITAL_COMMUNITY): Payer: Self-pay | Admitting: Gastroenterology

## 2022-05-13 ENCOUNTER — Other Ambulatory Visit: Payer: Self-pay | Admitting: Family Medicine

## 2022-05-13 ENCOUNTER — Other Ambulatory Visit (HOSPITAL_BASED_OUTPATIENT_CLINIC_OR_DEPARTMENT_OTHER): Payer: Self-pay

## 2022-05-13 DIAGNOSIS — R0981 Nasal congestion: Secondary | ICD-10-CM

## 2022-05-13 MED ORDER — MONTELUKAST SODIUM 10 MG PO TABS
10.0000 mg | ORAL_TABLET | Freq: Every day | ORAL | 3 refills | Status: DC
  Filled 2022-05-13: qty 30, 30d supply, fill #0
  Filled 2022-06-15: qty 30, 30d supply, fill #1
  Filled 2022-07-12: qty 30, 30d supply, fill #2
  Filled 2022-08-10: qty 30, 30d supply, fill #3

## 2022-05-13 MED ORDER — GABAPENTIN 400 MG PO CAPS
ORAL_CAPSULE | Freq: Every day | ORAL | 5 refills | Status: DC
Start: 2022-05-13 — End: 2022-11-10
  Filled 2022-05-13: qty 60, 30d supply, fill #0
  Filled 2022-06-15: qty 60, 30d supply, fill #1
  Filled 2022-07-12: qty 60, 30d supply, fill #2
  Filled 2022-08-10: qty 60, 30d supply, fill #3
  Filled 2022-09-06: qty 60, 30d supply, fill #4
  Filled 2022-10-10: qty 60, 30d supply, fill #5

## 2022-05-31 ENCOUNTER — Other Ambulatory Visit (HOSPITAL_BASED_OUTPATIENT_CLINIC_OR_DEPARTMENT_OTHER): Payer: Self-pay

## 2022-06-16 ENCOUNTER — Other Ambulatory Visit (HOSPITAL_BASED_OUTPATIENT_CLINIC_OR_DEPARTMENT_OTHER): Payer: Self-pay

## 2022-06-27 ENCOUNTER — Other Ambulatory Visit: Payer: Self-pay | Admitting: Family Medicine

## 2022-06-27 ENCOUNTER — Other Ambulatory Visit (HOSPITAL_BASED_OUTPATIENT_CLINIC_OR_DEPARTMENT_OTHER): Payer: Self-pay

## 2022-06-27 DIAGNOSIS — G4720 Circadian rhythm sleep disorder, unspecified type: Secondary | ICD-10-CM

## 2022-06-27 DIAGNOSIS — G4700 Insomnia, unspecified: Secondary | ICD-10-CM

## 2022-06-28 ENCOUNTER — Telehealth (INDEPENDENT_AMBULATORY_CARE_PROVIDER_SITE_OTHER): Payer: 59 | Admitting: Medical

## 2022-06-28 ENCOUNTER — Other Ambulatory Visit (HOSPITAL_BASED_OUTPATIENT_CLINIC_OR_DEPARTMENT_OTHER): Payer: Self-pay

## 2022-06-28 ENCOUNTER — Encounter: Payer: Self-pay | Admitting: Medical

## 2022-06-28 VITALS — Temp 98.5°F | Ht 70.0 in | Wt 273.0 lb

## 2022-06-28 DIAGNOSIS — J40 Bronchitis, not specified as acute or chronic: Secondary | ICD-10-CM | POA: Diagnosis not present

## 2022-06-28 DIAGNOSIS — R6889 Other general symptoms and signs: Secondary | ICD-10-CM

## 2022-06-28 DIAGNOSIS — R059 Cough, unspecified: Secondary | ICD-10-CM | POA: Diagnosis not present

## 2022-06-28 MED ORDER — BELSOMRA 20 MG PO TABS
20.0000 mg | ORAL_TABLET | Freq: Every evening | ORAL | 5 refills | Status: DC
  Filled 2022-06-28: qty 30, 30d supply, fill #0
  Filled 2022-08-04: qty 30, 30d supply, fill #1
  Filled 2022-09-03: qty 30, 30d supply, fill #2
  Filled 2022-10-03: qty 30, 30d supply, fill #3
  Filled 2022-11-01: qty 30, 30d supply, fill #4
  Filled 2022-12-01 – 2022-12-02 (×2): qty 30, 30d supply, fill #5

## 2022-06-28 MED ORDER — HYDROCODONE BIT-HOMATROP MBR 5-1.5 MG/5ML PO SOLN
5.0000 mL | Freq: Four times a day (QID) | ORAL | 0 refills | Status: DC | PRN
  Filled 2022-06-28: qty 120, 6d supply, fill #0

## 2022-06-28 MED ORDER — BENZONATATE 100 MG PO CAPS
100.0000 mg | ORAL_CAPSULE | Freq: Three times a day (TID) | ORAL | 0 refills | Status: DC | PRN
  Filled 2022-06-28: qty 30, 10d supply, fill #0

## 2022-06-28 MED ORDER — ALBUTEROL SULFATE HFA 108 (90 BASE) MCG/ACT IN AERS
2.0000 | INHALATION_SPRAY | Freq: Four times a day (QID) | RESPIRATORY_TRACT | 0 refills | Status: DC | PRN
  Filled 2022-06-28: qty 6.7, 25d supply, fill #0

## 2022-06-28 NOTE — Patient Instructions (Signed)
Describes severe frequent productive cough over the past 2 weeks with some occasional brief wheezing.  Will treat for bronchitis.  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  Making making albuterol inhaler available if wheezing worsens. If symptoms persist despite the above then get cxr. Order placed.  Follow up in 7 days or sooner if needed

## 2022-06-28 NOTE — Progress Notes (Signed)
   Subjective:    Patient ID: Jonathon Colon, male    DOB: 04-18-1971, 51 y.o.   MRN: [id number]  HPI  Virtual Visit via Video Note  I connected with Jonathon Colon on 06/28/22 at  2:40 PM EDT by a video enabled telemedicine application and verified that I am speaking with the correct person using two identifiers.  Location: Patient: home Provider: office.   I discussed the limitations of evaluation and management by telemedicine and the availability of in person appointments. The patient expressed understanding and agreed to proceed.  History of Present Illness: Pt states he has had productive cough with at times varying in color. Sometimes  frothy whitish thin but other time thick yellow/green. Progressively worse over past 2 weeks.  Feeling some chest congestion.  Cough can be severe.   Occasional brief wheezing. No hx of asthma. Moderate allergies.  Pt is on montlukast and zyrtec.     Observations/Objective:  General-no acute distress, pleasant, oriented. Lungs- on inspection lungs appear unlabored. Neck- no tracheal deviation or jvd on inspection. Neuro- gross motor function appears intact.   Assessment and Plan:  Patient Instructions  Describes severe frequent productive cough over the past 2 weeks with some occasional brief wheezing.  Will treat for bronchitis.  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  Making making albuterol inhaler available if wheezing worsens. If symptoms persist despite the above then get cxr. Order placed.  Follow up in 7 days or sooner if needed   Mackie Pai, PA-C  Follow Up Instructions:    I discussed the assessment and treatment plan with the patient. The patient was provided an opportunity to ask questions and all were answered. The patient agreed with the plan and demonstrated an understanding of the instructions.    The patient was advised to call back or seek an in-person evaluation if the symptoms worsen or if the condition fails to improve as anticipated.     Mackie Pai, PA-C   Review of Systems     Objective:   Physical Exam        Assessment & Plan:

## 2022-06-29 ENCOUNTER — Encounter: Payer: Self-pay | Admitting: Medical

## 2022-06-29 ENCOUNTER — Telehealth: Payer: Self-pay | Admitting: Family Medicine

## 2022-06-29 ENCOUNTER — Other Ambulatory Visit (HOSPITAL_BASED_OUTPATIENT_CLINIC_OR_DEPARTMENT_OTHER): Payer: Self-pay

## 2022-06-29 MED ORDER — DOXYCYCLINE HYCLATE 100 MG PO TABS
100.0000 mg | ORAL_TABLET | Freq: Two times a day (BID) | ORAL | 0 refills | Status: DC
  Filled 2022-06-29: qty 20, 10d supply, fill #0

## 2022-06-29 NOTE — Telephone Encounter (Signed)
Rebekah Chesterfield (spouse) called stating that the doxycycline that was supposed to be prescribed was not sent into the pharmacy. After reviewing chart, confirmed that it was supposed to be prescribed per visit notes and no order was sent in for it.

## 2022-06-29 NOTE — Telephone Encounter (Signed)
Please assist in sending in antibiotic for pt. He had a VV yesterday. Notes stated that doxy was supposed to be sent in.

## 2022-06-29 NOTE — Telephone Encounter (Signed)
Noted.   I have called the pt to let him know that we have sent in the medication and there was no answer so I left a message on his answering machine stating that rx has been sent down stairs.

## 2022-06-29 NOTE — Addendum Note (Signed)
Addended by: Anabel Halon on: 06/29/2022 11:26 AM   Modules accepted: Orders

## 2022-06-30 ENCOUNTER — Encounter: Payer: Self-pay | Admitting: Medical

## 2022-06-30 ENCOUNTER — Other Ambulatory Visit (HOSPITAL_BASED_OUTPATIENT_CLINIC_OR_DEPARTMENT_OTHER): Payer: Self-pay

## 2022-06-30 MED ORDER — PROMETHAZINE-CODEINE 6.25-10 MG/5ML PO SYRP
5.0000 mL | ORAL_SOLUTION | Freq: Three times a day (TID) | ORAL | 0 refills | Status: DC | PRN
  Filled 2022-06-30: qty 118, 8d supply, fill #0

## 2022-06-30 NOTE — Addendum Note (Signed)
Addended by: Anabel Halon on: 06/30/2022 01:53 PM   Modules accepted: Orders

## 2022-07-12 ENCOUNTER — Other Ambulatory Visit (HOSPITAL_BASED_OUTPATIENT_CLINIC_OR_DEPARTMENT_OTHER): Payer: Self-pay

## 2022-07-18 ENCOUNTER — Other Ambulatory Visit (HOSPITAL_BASED_OUTPATIENT_CLINIC_OR_DEPARTMENT_OTHER): Payer: Self-pay

## 2022-07-18 ENCOUNTER — Encounter: Payer: Self-pay | Admitting: Family Medicine

## 2022-07-18 ENCOUNTER — Ambulatory Visit: Payer: 59 | Admitting: Family Medicine

## 2022-07-18 VITALS — BP 130/85 | HR 88 | Temp 98.1°F | Ht 70.0 in | Wt 275.1 lb

## 2022-07-18 DIAGNOSIS — R052 Subacute cough: Secondary | ICD-10-CM

## 2022-07-18 DIAGNOSIS — Z566 Other physical and mental strain related to work: Secondary | ICD-10-CM | POA: Diagnosis not present

## 2022-07-18 MED ORDER — FLUTICASONE-SALMETEROL 250-50 MCG/ACT IN AEPB
1.0000 | INHALATION_SPRAY | Freq: Two times a day (BID) | RESPIRATORY_TRACT | 0 refills | Status: DC
  Filled 2022-07-18: qty 60, 30d supply, fill #0

## 2022-07-18 MED ORDER — PROMETHAZINE-DM 6.25-15 MG/5ML PO SYRP
5.0000 mL | ORAL_SOLUTION | Freq: Four times a day (QID) | ORAL | 0 refills | Status: DC | PRN
  Filled 2022-07-18: qty 118, 6d supply, fill #0

## 2022-07-18 NOTE — Patient Instructions (Addendum)
Continue to push fluids, practice good hand hygiene, and cover your mouth if you cough.  If you start having fevers, shaking or shortness of breath, seek immediate care.  OK to take Tylenol 1000 mg (2 extra strength tabs) or 975 mg (3 regular strength tabs) every 6 hours as needed.  Rinse your mouth out after using the inhaler.   Let us know if you need anything.  Please consider counseling. Contact [phone] to schedule an appointment or inquire about cost/insurance coverage.  [facility] located at [place], [place], [place].  Phone number = [phone].  Dr. Lennice Sites - Adult Psychiatry.    [facility] located at [place], [place], [place]. Phone number = [phone].   [facility] located at [address] [place], [place].  Phone number = [phone].   [facility] located at [place], [place], [place].  Phone number = [phone].

## 2022-07-18 NOTE — Progress Notes (Signed)
Chief Complaint  Patient presents with   Cough    Congestion     Jonathon Colon here for URI complaints.  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  He is not on any medication for anxiety/stress but would like to perhaps see a counselor/therapist to talk things out.  He is enjoys his job overall and enjoys who he works with.  He would prefer not to go to a larger ER at this time but is frustrated with the apparent lack of funds for both him and his colleagues.  No homicidal or suicidal ideation.  No self-medication.  Past Medical History:  Diagnosis Date   Hypertension     Objective BP 130/85   Pulse 88   Temp 98.1 F (36.7 C) (Oral)   Ht '5\' 10"'$  (1.778 m)   Wt 275 lb 2 oz (124.8 kg)   SpO2 97%   BMI 39.48 kg/m  General: Awake, alert, appears stated age HEENT: AT, [place], ears patent b/l and TM's neg, nares patent w/o discharge, pharynx pink and without exudates, MMM Neck: No masses or asymmetry Heart: RRR Lungs: CTAB, no accessory muscle use Psych: Age appropriate judgment and insight, normal mood and affect  Subacute cough - Plan: fluticasone-salmeterol (ADVAIR DISKUS) 250-50 MCG/ACT AEPB, promethazine-dextromethorphan (PROMETHAZINE-DM) 6.25-15 MG/5ML syrup  Stress at work - Plan: Ambulatory referral to Psychology  1 month trial of Advair.  Phenergan DM as needed.  If no improvement, would consider PFTs and a chest x-ray. Referral to psychology placed today.  Counseling resources provided in paperwork today.  Not currently interested in a medication. Pt voiced understanding and agreement to the plan.  [place], DO 07/18/22 4:10 PM

## 2022-08-04 ENCOUNTER — Other Ambulatory Visit (HOSPITAL_BASED_OUTPATIENT_CLINIC_OR_DEPARTMENT_OTHER): Payer: Self-pay

## 2022-08-10 ENCOUNTER — Other Ambulatory Visit (HOSPITAL_BASED_OUTPATIENT_CLINIC_OR_DEPARTMENT_OTHER): Payer: Self-pay

## 2022-09-05 ENCOUNTER — Other Ambulatory Visit (HOSPITAL_BASED_OUTPATIENT_CLINIC_OR_DEPARTMENT_OTHER): Payer: Self-pay

## 2022-09-06 ENCOUNTER — Other Ambulatory Visit (HOSPITAL_BASED_OUTPATIENT_CLINIC_OR_DEPARTMENT_OTHER): Payer: Self-pay

## 2022-09-06 ENCOUNTER — Other Ambulatory Visit: Payer: Self-pay | Admitting: Family Medicine

## 2022-09-06 DIAGNOSIS — R0981 Nasal congestion: Secondary | ICD-10-CM

## 2022-09-06 MED ORDER — MONTELUKAST SODIUM 10 MG PO TABS
10.0000 mg | ORAL_TABLET | Freq: Every day | ORAL | 3 refills | Status: DC
  Filled 2022-09-06: qty 30, 30d supply, fill #0
  Filled 2022-10-10: qty 30, 30d supply, fill #1
  Filled 2022-11-10: qty 30, 30d supply, fill #2
  Filled 2022-12-11: qty 30, 30d supply, fill #3

## 2022-09-07 ENCOUNTER — Other Ambulatory Visit (HOSPITAL_BASED_OUTPATIENT_CLINIC_OR_DEPARTMENT_OTHER): Payer: Self-pay

## 2022-09-23 ENCOUNTER — Other Ambulatory Visit (HOSPITAL_BASED_OUTPATIENT_CLINIC_OR_DEPARTMENT_OTHER): Payer: Self-pay

## 2022-09-23 ENCOUNTER — Other Ambulatory Visit: Payer: Self-pay | Admitting: Family Medicine

## 2022-09-23 DIAGNOSIS — G4720 Circadian rhythm sleep disorder, unspecified type: Secondary | ICD-10-CM

## 2022-09-23 DIAGNOSIS — G4700 Insomnia, unspecified: Secondary | ICD-10-CM

## 2022-09-23 MED ORDER — TRAZODONE HCL 150 MG PO TABS
150.0000 mg | ORAL_TABLET | Freq: Every day | ORAL | 2 refills | Status: DC
  Filled 2022-09-23: qty 90, 90d supply, fill #0
  Filled 2022-12-11 – 2022-12-15 (×2): qty 90, 90d supply, fill #1
  Filled 2023-03-14: qty 90, 90d supply, fill #2

## 2022-10-03 ENCOUNTER — Other Ambulatory Visit (HOSPITAL_BASED_OUTPATIENT_CLINIC_OR_DEPARTMENT_OTHER): Payer: Self-pay

## 2022-10-10 ENCOUNTER — Other Ambulatory Visit (HOSPITAL_BASED_OUTPATIENT_CLINIC_OR_DEPARTMENT_OTHER): Payer: Self-pay

## 2022-10-21 ENCOUNTER — Other Ambulatory Visit: Payer: Self-pay | Admitting: Medical

## 2022-10-21 ENCOUNTER — Other Ambulatory Visit (HOSPITAL_BASED_OUTPATIENT_CLINIC_OR_DEPARTMENT_OTHER): Payer: Self-pay

## 2022-10-21 ENCOUNTER — Other Ambulatory Visit: Payer: Self-pay | Admitting: Family Medicine

## 2022-10-21 ENCOUNTER — Encounter: Payer: Self-pay | Admitting: Family Medicine

## 2022-10-21 ENCOUNTER — Other Ambulatory Visit: Payer: Self-pay

## 2022-10-21 MED ORDER — BENZONATATE 100 MG PO CAPS
100.0000 mg | ORAL_CAPSULE | Freq: Three times a day (TID) | ORAL | 0 refills | Status: DC | PRN
  Filled 2022-10-21: qty 30, 10d supply, fill #0

## 2022-10-21 NOTE — Telephone Encounter (Signed)
Pt called back. Please advise.

## 2022-11-10 ENCOUNTER — Other Ambulatory Visit: Payer: Self-pay | Admitting: Family Medicine

## 2022-11-10 ENCOUNTER — Other Ambulatory Visit (HOSPITAL_BASED_OUTPATIENT_CLINIC_OR_DEPARTMENT_OTHER): Payer: Self-pay

## 2022-11-10 ENCOUNTER — Other Ambulatory Visit: Payer: Self-pay

## 2022-11-10 MED ORDER — GABAPENTIN 400 MG PO CAPS
800.0000 mg | ORAL_CAPSULE | Freq: Every day | ORAL | 5 refills | Status: DC
  Filled 2022-11-10: qty 60, 30d supply, fill #0
  Filled 2022-12-01 – 2022-12-09 (×2): qty 60, 30d supply, fill #1
  Filled 2023-01-11: qty 60, 30d supply, fill #2
  Filled 2023-02-09 – 2023-02-10 (×2): qty 60, 30d supply, fill #3
  Filled 2023-02-10 – 2023-03-14 (×2): qty 60, 30d supply, fill #4
  Filled 2023-04-12: qty 60, 30d supply, fill #5

## 2022-11-24 DIAGNOSIS — H52203 Unspecified astigmatism, bilateral: Secondary | ICD-10-CM | POA: Diagnosis not present

## 2022-11-24 DIAGNOSIS — H524 Presbyopia: Secondary | ICD-10-CM | POA: Diagnosis not present

## 2022-11-24 DIAGNOSIS — H5213 Myopia, bilateral: Secondary | ICD-10-CM | POA: Diagnosis not present

## 2022-12-02 ENCOUNTER — Other Ambulatory Visit (HOSPITAL_BASED_OUTPATIENT_CLINIC_OR_DEPARTMENT_OTHER): Payer: Self-pay

## 2022-12-06 ENCOUNTER — Other Ambulatory Visit (HOSPITAL_BASED_OUTPATIENT_CLINIC_OR_DEPARTMENT_OTHER): Payer: Self-pay

## 2022-12-12 ENCOUNTER — Other Ambulatory Visit (HOSPITAL_BASED_OUTPATIENT_CLINIC_OR_DEPARTMENT_OTHER): Payer: Self-pay

## 2022-12-12 ENCOUNTER — Other Ambulatory Visit: Payer: Self-pay

## 2023-01-11 ENCOUNTER — Other Ambulatory Visit: Payer: Self-pay | Admitting: Family Medicine

## 2023-01-11 ENCOUNTER — Other Ambulatory Visit: Payer: Self-pay

## 2023-01-11 ENCOUNTER — Other Ambulatory Visit (HOSPITAL_BASED_OUTPATIENT_CLINIC_OR_DEPARTMENT_OTHER): Payer: Self-pay

## 2023-01-11 DIAGNOSIS — R0981 Nasal congestion: Secondary | ICD-10-CM

## 2023-01-11 MED ORDER — MONTELUKAST SODIUM 10 MG PO TABS
10.0000 mg | ORAL_TABLET | Freq: Every day | ORAL | 3 refills | Status: DC
  Filled 2023-01-11: qty 30, 30d supply, fill #0
  Filled 2023-02-09: qty 30, 30d supply, fill #1
  Filled 2023-02-10: qty 30, 30d supply, fill #2
  Filled 2023-02-10: qty 30, 30d supply, fill #1
  Filled 2023-03-14: qty 30, 30d supply, fill #2
  Filled 2023-04-12: qty 30, 30d supply, fill #3

## 2023-01-29 IMAGING — DX DG NECK SOFT TISSUE
2 series · 2 of 2 positions shown · non-contrast
Comparison: None Available.

CLINICAL DATA: reports swallowed chicken bone feels stuck at level
of larynx

EXAM:
NECK SOFT TISSUES - 1+ VIEW

[neck lat]
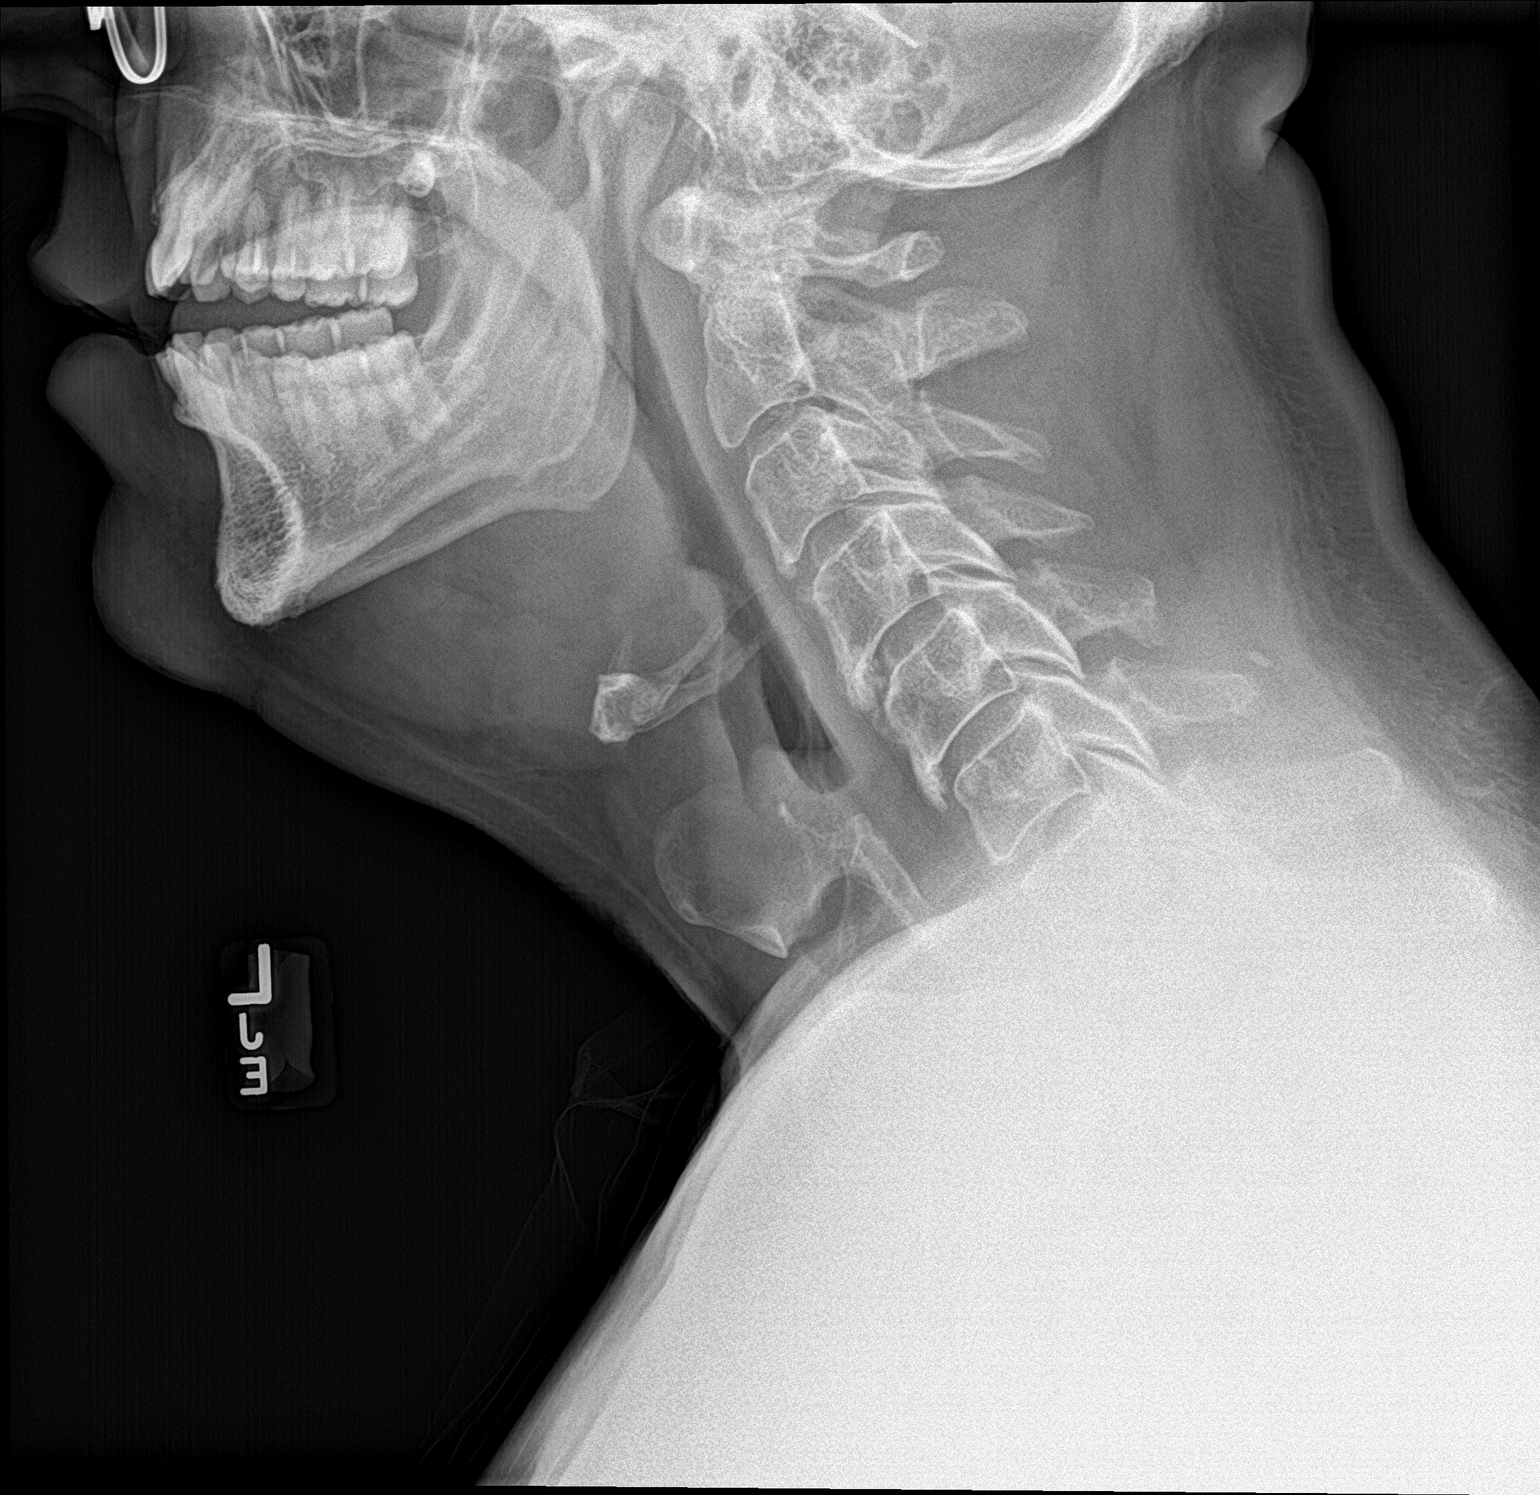

[neck ap]
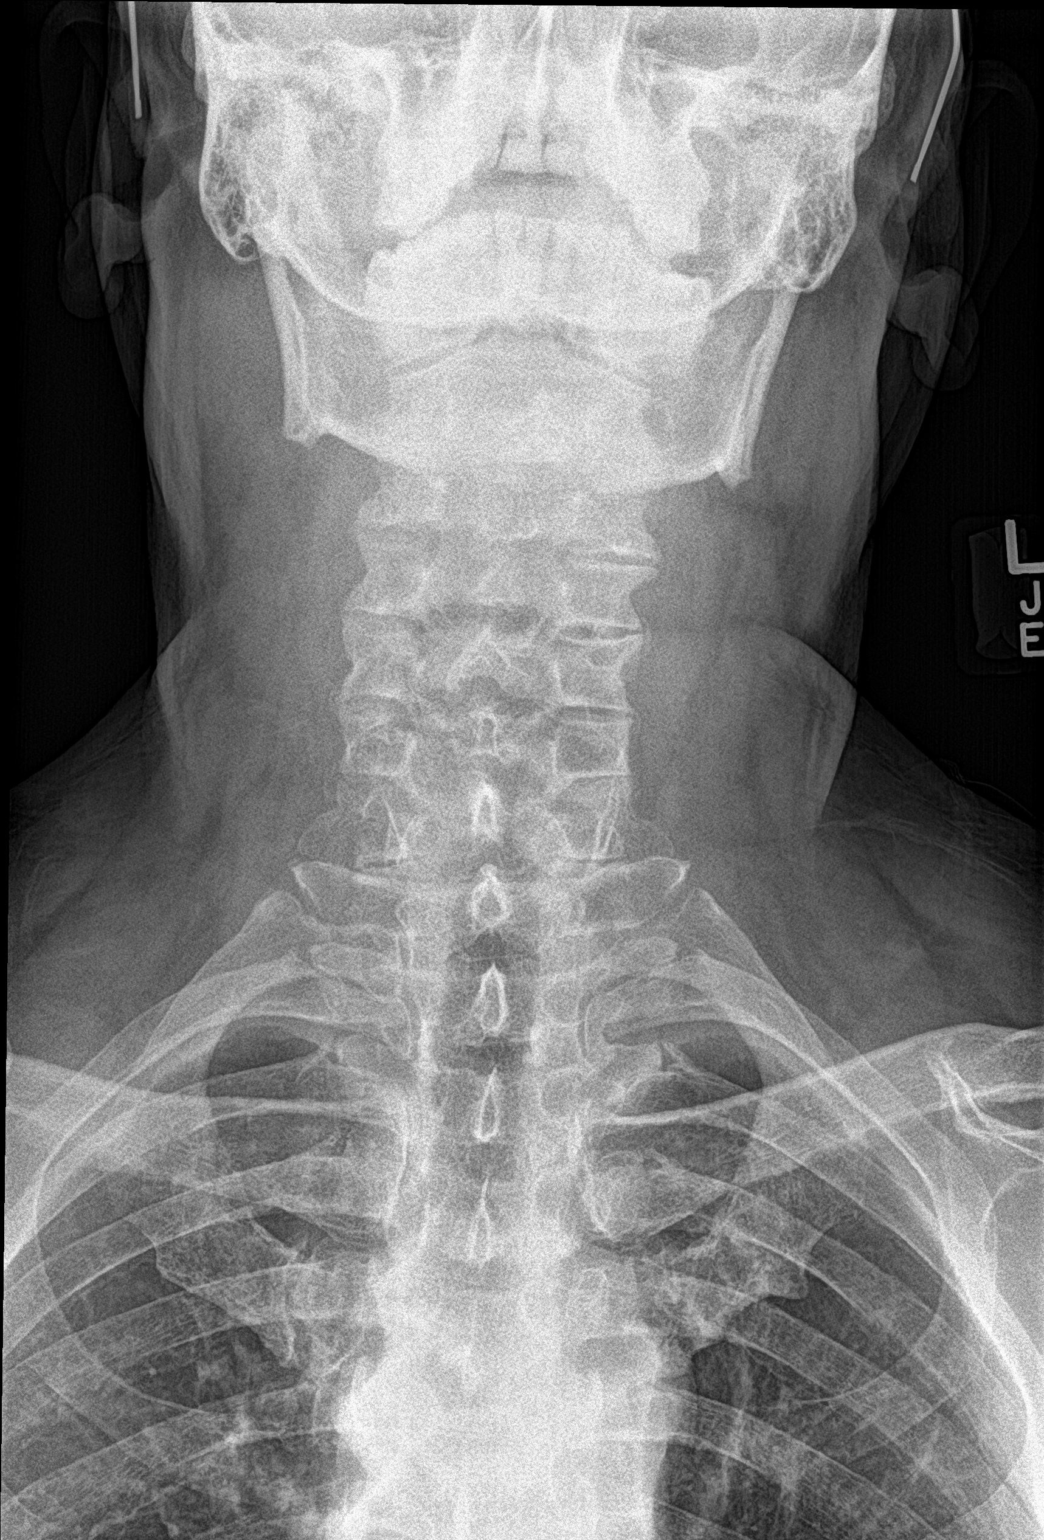

[2 of 2 positions shown; findings below may reference images not displayed]

FINDINGS: There is no evidence of retropharyngeal soft tissue swelling or
epiglottic enlargement. The cervical airway is unremarkable and no
radio-opaque foreign body identified.
IMPRESSION: Negative.

## 2023-02-09 ENCOUNTER — Other Ambulatory Visit: Payer: Self-pay

## 2023-02-10 ENCOUNTER — Other Ambulatory Visit: Payer: Self-pay

## 2023-02-10 ENCOUNTER — Other Ambulatory Visit (HOSPITAL_BASED_OUTPATIENT_CLINIC_OR_DEPARTMENT_OTHER): Payer: Self-pay

## 2023-02-12 ENCOUNTER — Other Ambulatory Visit: Payer: Self-pay | Admitting: Family Medicine

## 2023-02-13 ENCOUNTER — Other Ambulatory Visit (HOSPITAL_BASED_OUTPATIENT_CLINIC_OR_DEPARTMENT_OTHER): Payer: Self-pay

## 2023-02-13 MED ORDER — BENZONATATE 100 MG PO CAPS
100.0000 mg | ORAL_CAPSULE | Freq: Three times a day (TID) | ORAL | 0 refills | Status: DC | PRN
  Filled 2023-02-13: qty 30, 10d supply, fill #0

## 2023-02-15 ENCOUNTER — Other Ambulatory Visit: Payer: Self-pay | Admitting: Family Medicine

## 2023-02-15 ENCOUNTER — Other Ambulatory Visit (HOSPITAL_BASED_OUTPATIENT_CLINIC_OR_DEPARTMENT_OTHER): Payer: Self-pay

## 2023-02-15 DIAGNOSIS — R052 Subacute cough: Secondary | ICD-10-CM

## 2023-02-15 MED ORDER — PROMETHAZINE-DM 6.25-15 MG/5ML PO SYRP
5.0000 mL | ORAL_SOLUTION | Freq: Four times a day (QID) | ORAL | 0 refills | Status: DC | PRN
  Filled 2023-02-15: qty 118, 6d supply, fill #0

## 2023-02-15 NOTE — Telephone Encounter (Signed)
If this is a post infectious cough, OK to cont, but if he is having many symptoms, I would like to see him. Ty.

## 2023-02-16 ENCOUNTER — Other Ambulatory Visit (HOSPITAL_BASED_OUTPATIENT_CLINIC_OR_DEPARTMENT_OTHER): Payer: Self-pay

## 2023-04-03 ENCOUNTER — Other Ambulatory Visit (HOSPITAL_BASED_OUTPATIENT_CLINIC_OR_DEPARTMENT_OTHER): Payer: Self-pay

## 2023-05-05 ENCOUNTER — Encounter: Payer: Self-pay | Admitting: Family Medicine

## 2023-05-11 ENCOUNTER — Other Ambulatory Visit (HOSPITAL_BASED_OUTPATIENT_CLINIC_OR_DEPARTMENT_OTHER): Payer: Self-pay

## 2023-05-11 ENCOUNTER — Other Ambulatory Visit: Payer: Self-pay | Admitting: Family Medicine

## 2023-05-11 DIAGNOSIS — R0981 Nasal congestion: Secondary | ICD-10-CM

## 2023-05-12 ENCOUNTER — Other Ambulatory Visit: Payer: Self-pay

## 2023-05-12 ENCOUNTER — Other Ambulatory Visit (HOSPITAL_BASED_OUTPATIENT_CLINIC_OR_DEPARTMENT_OTHER): Payer: Self-pay

## 2023-05-12 MED ORDER — MONTELUKAST SODIUM 10 MG PO TABS
10.0000 mg | ORAL_TABLET | Freq: Every day | ORAL | 3 refills | Status: DC
  Filled 2023-05-12: qty 30, 30d supply, fill #0
  Filled 2023-06-13: qty 30, 30d supply, fill #1
  Filled 2023-07-23: qty 30, 30d supply, fill #2
  Filled 2023-08-20: qty 30, 30d supply, fill #3

## 2023-05-12 MED ORDER — GABAPENTIN 400 MG PO CAPS
800.0000 mg | ORAL_CAPSULE | Freq: Every day | ORAL | 5 refills | Status: DC
  Filled 2023-05-12: qty 60, 30d supply, fill #0
  Filled 2023-06-13: qty 60, 30d supply, fill #1
  Filled 2023-07-23: qty 60, 30d supply, fill #2
  Filled 2023-08-20: qty 60, 30d supply, fill #3
  Filled 2023-09-23: qty 60, 30d supply, fill #4
  Filled 2023-10-30: qty 60, 30d supply, fill #5

## 2023-06-05 ENCOUNTER — Ambulatory Visit: Payer: Commercial Managed Care - PPO | Admitting: Family Medicine

## 2023-06-05 ENCOUNTER — Encounter: Payer: Self-pay | Admitting: Family Medicine

## 2023-06-05 ENCOUNTER — Other Ambulatory Visit: Payer: Self-pay | Admitting: Family Medicine

## 2023-06-05 VITALS — BP 122/78 | HR 100 | Temp 98.7°F | Ht 70.0 in | Wt 267.5 lb

## 2023-06-05 DIAGNOSIS — Z1211 Encounter for screening for malignant neoplasm of colon: Secondary | ICD-10-CM | POA: Diagnosis not present

## 2023-06-05 DIAGNOSIS — Z0000 Encounter for general adult medical examination without abnormal findings: Secondary | ICD-10-CM

## 2023-06-05 DIAGNOSIS — Z23 Encounter for immunization: Secondary | ICD-10-CM | POA: Diagnosis not present

## 2023-06-05 DIAGNOSIS — Z125 Encounter for screening for malignant neoplasm of prostate: Secondary | ICD-10-CM | POA: Diagnosis not present
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
## 2023-06-05 NOTE — Addendum Note (Signed)
Addended by: Scharlene Gloss B on: 06/05/2023 04:23 PM   Modules accepted: Orders
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
## 2023-06-06 ENCOUNTER — Other Ambulatory Visit (INDEPENDENT_AMBULATORY_CARE_PROVIDER_SITE_OTHER): Payer: Commercial Managed Care - PPO

## 2023-06-06 ENCOUNTER — Other Ambulatory Visit: Payer: Self-pay | Admitting: Family Medicine

## 2023-06-06 ENCOUNTER — Other Ambulatory Visit (HOSPITAL_BASED_OUTPATIENT_CLINIC_OR_DEPARTMENT_OTHER): Payer: Self-pay

## 2023-06-06 DIAGNOSIS — R739 Hyperglycemia, unspecified: Secondary | ICD-10-CM

## 2023-06-06 LAB — LIPID PANEL
Cholesterol: 150 mg/dL (ref 0–200)
HDL: 37.8 mg/dL — ABNORMAL LOW (ref >39.00)
LDL Cholesterol: 92 mg/dL (ref 0–99)
NonHDL: 112.34
Total CHOL/HDL Ratio: 4
Triglycerides: 102 mg/dL (ref 0.0–149.0)
VLDL: 20.4 mg/dL (ref 0.0–40.0)

## 2023-06-06 LAB — COMPREHENSIVE METABOLIC PANEL
ALT: 28 U/L (ref 0–53)
AST: 20 U/L (ref 0–37)
Albumin: 4.4 g/dL (ref 3.5–5.2)
Alkaline Phosphatase: 56 U/L (ref 39–117)
BUN: 10 mg/dL (ref 6–23)
CO2: 24 mEq/L (ref 19–32)
Calcium: 9.9 mg/dL (ref 8.4–10.5)
Chloride: 107 mEq/L (ref 96–112)
Creatinine, Ser: 0.72 mg/dL (ref 0.40–1.50)
GFR: 104.12 mL/min (ref >60.00)
Glucose, Bld: 108 mg/dL — ABNORMAL HIGH (ref 70–99)
Potassium: 4.2 mEq/L (ref 3.5–5.1)
Sodium: 139 mEq/L (ref 135–145)
Total Bilirubin: 0.8 mg/dL (ref 0.2–1.2)
Total Protein: 7.2 g/dL (ref 6.0–8.3)

## 2023-06-06 LAB — HEMOGLOBIN A1C: Hgb A1c MFr Bld: 6.4 % (ref 4.6–6.5)

## 2023-06-06 LAB — CBC
HCT: 41.2 % (ref 39.0–52.0)
Hemoglobin: 14.2 g/dL (ref 13.0–17.0)
MCHC: 34.4 g/dL (ref 30.0–36.0)
MCV: 89.7 fl (ref 78.0–100.0)
Platelets: 265 10*3/uL (ref 150.0–400.0)
RBC: 4.59 Mil/uL (ref 4.22–5.81)
RDW: 13.7 % (ref 11.5–15.5)
WBC: 6.1 10*3/uL (ref 4.0–10.5)

## 2023-06-06 LAB — PSA: PSA: 0.6 ng/mL (ref 0.10–4.00)

## 2023-06-06 MED ORDER — DULOXETINE HCL 20 MG PO CPEP
20.0000 mg | ORAL_CAPSULE | Freq: Every day | ORAL | 3 refills | Status: DC
  Filled 2023-06-06: qty 90, 90d supply, fill #0
  Filled 2023-09-04: qty 90, 90d supply, fill #1
  Filled 2023-12-29: qty 90, 90d supply, fill #2
  Filled 2024-05-01: qty 90, 90d supply, fill #3

## 2023-06-13 ENCOUNTER — Other Ambulatory Visit: Payer: Self-pay

## 2023-06-13 ENCOUNTER — Other Ambulatory Visit: Payer: Self-pay | Admitting: Family Medicine

## 2023-06-13 ENCOUNTER — Other Ambulatory Visit (HOSPITAL_BASED_OUTPATIENT_CLINIC_OR_DEPARTMENT_OTHER): Payer: Self-pay

## 2023-06-13 DIAGNOSIS — G4720 Circadian rhythm sleep disorder, unspecified type: Secondary | ICD-10-CM

## 2023-06-13 DIAGNOSIS — G4700 Insomnia, unspecified: Secondary | ICD-10-CM

## 2023-06-13 MED ORDER — TRAZODONE HCL 150 MG PO TABS
150.0000 mg | ORAL_TABLET | Freq: Every day | ORAL | 2 refills | Status: DC
  Filled 2023-06-13: qty 90, 90d supply, fill #0
  Filled 2023-09-07: qty 90, 90d supply, fill #1
  Filled 2023-12-29: qty 90, 90d supply, fill #2

## 2023-06-13 NOTE — Telephone Encounter (Signed)
Last OV--06/05/23 Last RF--09/23/22--#90 no refills

## 2023-09-07 ENCOUNTER — Other Ambulatory Visit (HOSPITAL_BASED_OUTPATIENT_CLINIC_OR_DEPARTMENT_OTHER): Payer: Self-pay

## 2023-09-15 ENCOUNTER — Other Ambulatory Visit (HOSPITAL_BASED_OUTPATIENT_CLINIC_OR_DEPARTMENT_OTHER): Payer: Self-pay

## 2023-09-23 ENCOUNTER — Encounter: Payer: Self-pay | Admitting: Family Medicine

## 2023-09-23 ENCOUNTER — Other Ambulatory Visit: Payer: Self-pay | Admitting: Family Medicine

## 2023-09-23 DIAGNOSIS — R0981 Nasal congestion: Secondary | ICD-10-CM

## 2023-09-25 ENCOUNTER — Other Ambulatory Visit: Payer: Self-pay

## 2023-09-25 ENCOUNTER — Other Ambulatory Visit: Payer: Self-pay | Admitting: Family Medicine

## 2023-09-25 ENCOUNTER — Other Ambulatory Visit (HOSPITAL_BASED_OUTPATIENT_CLINIC_OR_DEPARTMENT_OTHER): Payer: Self-pay

## 2023-09-25 MED ORDER — ONDANSETRON 4 MG PO TBDP
4.0000 mg | ORAL_TABLET | Freq: Three times a day (TID) | ORAL | 0 refills | Status: AC | PRN
  Filled 2023-09-25: qty 20, 7d supply, fill #0

## 2023-09-25 MED ORDER — MONTELUKAST SODIUM 10 MG PO TABS
10.0000 mg | ORAL_TABLET | Freq: Every day | ORAL | 3 refills | Status: DC
  Filled 2023-09-25: qty 30, 30d supply, fill #0
  Filled 2023-10-30: qty 30, 30d supply, fill #1
  Filled 2023-12-05: qty 30, 30d supply, fill #2
  Filled 2023-12-29: qty 30, 30d supply, fill #3

## 2023-11-29 DIAGNOSIS — H25813 Combined forms of age-related cataract, bilateral: Secondary | ICD-10-CM | POA: Diagnosis not present

## 2023-11-29 DIAGNOSIS — H35413 Lattice degeneration of retina, bilateral: Secondary | ICD-10-CM | POA: Diagnosis not present

## 2023-12-05 ENCOUNTER — Other Ambulatory Visit: Payer: Self-pay | Admitting: Family Medicine

## 2023-12-05 ENCOUNTER — Other Ambulatory Visit: Payer: Self-pay

## 2023-12-05 MED ORDER — GABAPENTIN 400 MG PO CAPS
800.0000 mg | ORAL_CAPSULE | Freq: Every day | ORAL | 5 refills | Status: DC
  Filled 2023-12-05: qty 60, 30d supply, fill #0
  Filled 2023-12-29: qty 60, 30d supply, fill #1
  Filled 2024-01-29: qty 60, 30d supply, fill #2
  Filled 2024-03-08: qty 60, 30d supply, fill #3
  Filled 2024-04-05: qty 60, 30d supply, fill #4
  Filled 2024-05-01: qty 60, 30d supply, fill #5

## 2023-12-06 ENCOUNTER — Ambulatory Visit: Payer: Commercial Managed Care - PPO | Admitting: Family Medicine

## 2023-12-06 VITALS — BP 128/80 | HR 84 | Temp 98.0°F | Resp 16 | Ht 70.0 in | Wt 273.0 lb

## 2023-12-06 DIAGNOSIS — F411 Generalized anxiety disorder: Secondary | ICD-10-CM | POA: Diagnosis not present

## 2023-12-06 DIAGNOSIS — G4700 Insomnia, unspecified: Secondary | ICD-10-CM

## 2023-12-06 NOTE — Patient Instructions (Signed)
 Keep the diet clean and stay active.  Aim to do some physical exertion for 150 minutes per week. This is typically divided into 5 days per week, 30 minutes per day. The activity should be enough to get your heart rate up. Anything is better than nothing if you have time constraints.  Let us know if you need anything.

## 2023-12-06 NOTE — Progress Notes (Signed)
 Chief Complaint  Patient presents with   Follow-up    Follow up    Subjective Jonathon Colon presents for f/u anxiety.  Pt is currently being treated with Cymbalta  20 mg/d.  Reports doing very well since treatment. No thoughts of harming self or others. No self-medication with alcohol, prescription drugs or illicit drugs. Pt is not following with a counselor/psychologist.  Insomnia Hx of insomnia. Taking trazodone  150 mg qhs and gabapentin  800 mg nightly prn. Reports compliance, no AEs. Works well overall.   Past Medical History:  Diagnosis Date   Hypertension    Allergies as of 12/06/2023   No Known Allergies      Medication List        Accurate as of December 06, 2023  4:26 PM. If you have any questions, ask your nurse or doctor.          STOP taking these medications    Advair  Diskus 250-50 MCG/ACT Aepb Generic drug: fluticasone -salmeterol Stopped by: Jobe Mulder   albuterol  [phone] Base) MCG/ACT inhaler Commonly known as: VENTOLIN  HFA Stopped by: Jobe Mulder   Belsomra  20 MG Tabs Generic drug: Suvorexant  Stopped by: Shellie Dials Myrella Fahs       TAKE these medications    DULoxetine  20 MG capsule Commonly known as: Cymbalta  Take 1 capsule (20 mg total) by mouth daily.   gabapentin  400 MG capsule Commonly known as: NEURONTIN  Take 2 capsules (800 mg total) by mouth at bedtime.   montelukast  10 MG tablet Commonly known as: SINGULAIR  Take 1 tablet (10 mg total) by mouth at bedtime.   ondansetron  4 MG disintegrating tablet Commonly known as: ZOFRAN -ODT Take 1 tablet (4 mg total) by mouth every 8 (eight) hours as needed for nausea or vomiting.   traZODone  150 MG tablet Commonly known as: DESYREL  Take 1 tablet (150 mg total) by mouth at bedtime.        Exam BP 128/80   Pulse 84   Temp 98 F (36.7 C) (Oral)   Resp 16   Ht 5\' 10"  (1.778 m)   Wt 273 lb (123.8 kg)   SpO2 98%   BMI 39.17 kg/m  General:  well  developed, well nourished, in no apparent distress Heart: RRR, no LE edema Lungs:  CTAB. No respiratory distress Psych: well oriented with normal range of affect and age-appropriate judgement/insight, alert and oriented x4.  Assessment and Plan  GAD (generalized anxiety disorder)  Insomnia, unspecified type  Chronic, stable.  Continue Cymbalta  20 mg daily. Chronic, stable.  Continue gabapentin  800 mg nightly as needed, trazodone  150 mg nightly as needed. F/u in 6 mo. The patient voiced understanding and agreement to the plan.  Shellie Dials [place], DO 12/06/23 4:26 PM

## 2023-12-29 ENCOUNTER — Other Ambulatory Visit: Payer: Self-pay

## 2024-01-29 ENCOUNTER — Other Ambulatory Visit: Payer: Self-pay | Admitting: Family Medicine

## 2024-01-29 ENCOUNTER — Other Ambulatory Visit: Payer: Self-pay

## 2024-01-29 DIAGNOSIS — R0981 Nasal congestion: Secondary | ICD-10-CM

## 2024-01-29 MED FILL — Montelukast Sodium Tab 10 MG (Base Equiv): ORAL | 30 days supply | Qty: 30 | Fill #0 | Status: AC

## 2024-02-04 ENCOUNTER — Encounter: Payer: Self-pay | Admitting: Family Medicine

## 2024-02-05 ENCOUNTER — Other Ambulatory Visit: Payer: Self-pay

## 2024-02-05 ENCOUNTER — Other Ambulatory Visit (HOSPITAL_BASED_OUTPATIENT_CLINIC_OR_DEPARTMENT_OTHER): Payer: Self-pay

## 2024-02-05 MED ORDER — FLUTICASONE-SALMETEROL 100-50 MCG/ACT IN AEPB
1.0000 | INHALATION_SPRAY | Freq: Two times a day (BID) | RESPIRATORY_TRACT | 3 refills | Status: AC
Start: 2024-02-05 — End: ?
  Filled 2024-02-05 (×2): qty 60, 30d supply, fill #0

## 2024-02-05 MED ORDER — BENZONATATE 200 MG PO CAPS
200.0000 mg | ORAL_CAPSULE | Freq: Two times a day (BID) | ORAL | 0 refills | Status: DC | PRN
  Filled 2024-02-05 (×2): qty 20, 10d supply, fill #0

## 2024-02-09 ENCOUNTER — Other Ambulatory Visit: Payer: Self-pay | Admitting: Family Medicine

## 2024-02-09 ENCOUNTER — Other Ambulatory Visit: Payer: Self-pay

## 2024-02-09 DIAGNOSIS — B9689 Other specified bacterial agents as the cause of diseases classified elsewhere: Secondary | ICD-10-CM

## 2024-02-09 MED ORDER — HYDROCODONE BIT-HOMATROP MBR 5-1.5 MG/5ML PO SOLN
5.0000 mL | Freq: Three times a day (TID) | ORAL | 0 refills | Status: DC | PRN
  Filled 2024-02-09: qty 120, 8d supply, fill #0

## 2024-02-09 MED ORDER — PROMETHAZINE-CODEINE 6.25-10 MG/5ML PO SYRP
5.0000 mL | ORAL_SOLUTION | Freq: Four times a day (QID) | ORAL | 0 refills | Status: DC | PRN
  Filled 2024-02-09: qty 120, 3d supply, fill #0

## 2024-02-12 ENCOUNTER — Ambulatory Visit: Admitting: Family Medicine

## 2024-02-12 ENCOUNTER — Other Ambulatory Visit: Payer: Self-pay

## 2024-02-12 ENCOUNTER — Encounter: Payer: Self-pay | Admitting: Family Medicine

## 2024-02-12 VITALS — BP 128/66 | HR 85 | Temp 98.8°F | Ht 70.0 in | Wt 276.2 lb

## 2024-02-12 DIAGNOSIS — J189 Pneumonia, unspecified organism: Secondary | ICD-10-CM

## 2024-02-12 MED ORDER — HYDROCODONE BIT-HOMATROP MBR 5-1.5 MG/5ML PO SOLN
5.0000 mL | Freq: Three times a day (TID) | ORAL | 0 refills | Status: DC | PRN
  Filled 2024-02-12: qty 120, 8d supply, fill #0

## 2024-02-12 MED ORDER — AZITHROMYCIN 250 MG PO TABS
ORAL_TABLET | ORAL | 0 refills | Status: DC
  Filled 2024-02-12: qty 6, 5d supply, fill #0

## 2024-02-12 NOTE — Patient Instructions (Signed)
 Do not drink alcohol, do any illicit/street drugs, drive or do anything that requires alertness while on this medicine.   Continue to push fluids, practice good hand hygiene, and cover your mouth if you cough.  If you start having fevers, shaking or shortness of breath, seek immediate care.  OK to take Tylenol 1000 mg (2 extra strength tabs) or 975 mg (3 regular strength tabs) every 6 hours as needed.  Let us know if you need anything.

## 2024-02-12 NOTE — Progress Notes (Signed)
 Chief Complaint  Patient presents with   Acute Visit    Patient presents today for follow-up on  body aches, cough, congestion w/ mucus for 3 week now.    Jonathon Colon here for URI complaints.  Duration: 3 weeks; significantly worsened over the past 4 d. Associated symptoms: sinus congestion, rhinorrhea, wheezing, shortness of breath, myalgia, and productive coughing Denies: sinus pain, ear pain, ear drainage, sore throat, myalgia, and fevers Treatment to date: Hycodan, Tessalon Perles Sick contacts: Co-workers, he also transports sick people for a living.   Past Medical History:  Diagnosis Date   Hypertension     Objective BP 128/66   Pulse 85   Temp 98.8 F (37.1 C)   Ht 5\' 10"  (1.778 m)   Wt 276 lb 3.2 oz (125.3 kg)   SpO2 98%   BMI 39.63 kg/m  General: Awake, alert, appears stated age HEENT: AT, [place], ears patent b/l and TM's neg, nares patent w/o discharge, pharynx pink and without exudates, MMM, no sinus ttp Neck: No masses or asymmetry Heart: RRR Lungs: Coarse breath sounds heard in the right lower lung field, no accessory muscle use Psych: Age appropriate judgment and insight, normal mood and affect  Community acquired pneumonia of right lower lobe of lung - Plan: azithromycin (ZITHROMAX) 250 MG tablet, HYDROcodone bit-homatropine (HYCODAN) 5-1.5 MG/5ML syrup  Continue to push fluids, practice good hand hygiene, cover mouth when coughing. Warnings about syrup verbalized and written down.  F/u prn. If starting to experience fevers, shaking, or shortness of breath, seek immediate care. Pt voiced understanding and agreement to the plan.  Jilda Roche [place], DO 02/12/24 3:47 PM

## 2024-02-15 ENCOUNTER — Other Ambulatory Visit: Payer: Self-pay

## 2024-02-15 ENCOUNTER — Other Ambulatory Visit: Payer: Self-pay | Admitting: Family Medicine

## 2024-02-15 MED ORDER — BENZONATATE 200 MG PO CAPS
200.0000 mg | ORAL_CAPSULE | Freq: Two times a day (BID) | ORAL | 0 refills | Status: DC | PRN
  Filled 2024-02-15: qty 20, 10d supply, fill #0

## 2024-03-08 MED FILL — Montelukast Sodium Tab 10 MG (Base Equiv): ORAL | 30 days supply | Qty: 30 | Fill #1 | Status: AC

## 2024-03-21 ENCOUNTER — Other Ambulatory Visit: Payer: Self-pay | Admitting: Family Medicine

## 2024-03-21 DIAGNOSIS — G4700 Insomnia, unspecified: Secondary | ICD-10-CM

## 2024-03-21 DIAGNOSIS — G4720 Circadian rhythm sleep disorder, unspecified type: Secondary | ICD-10-CM

## 2024-03-22 ENCOUNTER — Other Ambulatory Visit: Payer: Self-pay

## 2024-03-22 MED ORDER — TRAZODONE HCL 150 MG PO TABS
150.0000 mg | ORAL_TABLET | Freq: Every day | ORAL | 2 refills | Status: DC
  Filled 2024-03-22: qty 90, 90d supply, fill #0
  Filled 2024-06-23: qty 90, 90d supply, fill #1
  Filled 2024-09-26: qty 90, 90d supply, fill #2

## 2024-04-05 MED FILL — Montelukast Sodium Tab 10 MG (Base Equiv): ORAL | 30 days supply | Qty: 30 | Fill #2 | Status: AC

## 2024-05-01 ENCOUNTER — Ambulatory Visit: Admitting: Family Medicine

## 2024-05-01 ENCOUNTER — Encounter: Payer: Self-pay | Admitting: Family Medicine

## 2024-05-01 ENCOUNTER — Other Ambulatory Visit: Payer: Self-pay

## 2024-05-01 VITALS — BP 122/80 | HR 82 | Temp 98.0°F | Resp 16 | Ht 67.0 in | Wt 274.2 lb

## 2024-05-01 DIAGNOSIS — G8929 Other chronic pain: Secondary | ICD-10-CM | POA: Diagnosis not present

## 2024-05-01 DIAGNOSIS — R519 Headache, unspecified: Secondary | ICD-10-CM

## 2024-05-01 MED ORDER — RIZATRIPTAN BENZOATE 10 MG PO TABS
10.0000 mg | ORAL_TABLET | ORAL | 0 refills | Status: DC | PRN
  Filled 2024-05-01: qty 10, 30d supply, fill #0

## 2024-05-01 MED ORDER — PROPRANOLOL HCL ER 60 MG PO CP24
60.0000 mg | ORAL_CAPSULE | Freq: Every day | ORAL | 1 refills | Status: DC
  Filled 2024-05-01: qty 30, 30d supply, fill #0

## 2024-05-01 MED FILL — Montelukast Sodium Tab 10 MG (Base Equiv): ORAL | 30 days supply | Qty: 30 | Fill #3 | Status: AC
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
## 2024-05-01 NOTE — Patient Instructions (Signed)
 Someone will reach out scheduling your scan.   Take the Maxalt right when you get your visual aura.  Let us  know if you need anything.

## 2024-05-21 ENCOUNTER — Encounter: Payer: Self-pay | Admitting: Family Medicine

## 2024-05-22 ENCOUNTER — Telehealth: Payer: Self-pay

## 2024-05-22 ENCOUNTER — Other Ambulatory Visit: Payer: Self-pay | Admitting: Family Medicine

## 2024-05-22 DIAGNOSIS — G8929 Other chronic pain: Secondary | ICD-10-CM

## 2024-05-22 NOTE — Telephone Encounter (Signed)
 Copied from CRM [id number]. Topic: Clinical - Medication Refill >> May 22, 2024  4:57 PM Leah C wrote: Medication:  rizatriptan  (MAXALT ) 10 MG tablet Has the patient contacted their pharmacy? Patient said he submitted the request through MyChart but didn't see it.  (Agent: If no, request that the patient contact the pharmacy for the refill. If patient does not wish to contact the pharmacy document the reason why and proceed with request.) (Agent: If yes, when and what did the pharmacy advise?)  This is the patient's preferred pharmacy:  [facility] [address] [place] [address] Phone: [phone] Fax: [phone]  Is this the correct pharmacy for this prescription? Yes If no, delete pharmacy and type the correct one.   Has the prescription been filled recently? Yes  Is the patient out of the medication? Yes  Has the patient been seen for an appointment in the last year OR does the patient have an upcoming appointment? Yes  Can we respond through MyChart? Yes  Agent: Please be advised that Rx refills may take up to 3 business days. We ask that you follow-up with your pharmacy.

## 2024-05-22 NOTE — Telephone Encounter (Signed)
 Copied from CRM [id number]. Topic: General - Other >> May 22, 2024  3:32 PM Chiquita SQUIBB wrote: Reason for CRM: Patient is calling for [facility], it is regarding the mychart message he received and stated a phone call would be easier if we could get a call back, when available.  Called spoke with pt wife about the 2 appt that was Scheduled. 1 Month f/u and CPE. Pt was advised   We can keep the 7/16 appt and f/u come all be in 1.   Advised the wife if he is still needing the video visit  He can schedule it for next week.

## 2024-05-23 ENCOUNTER — Other Ambulatory Visit: Payer: Self-pay

## 2024-05-23 MED ORDER — RIZATRIPTAN BENZOATE 10 MG PO TABS
10.0000 mg | ORAL_TABLET | ORAL | 1 refills | Status: DC | PRN
  Filled 2024-05-23 – 2024-05-27 (×2): qty 10, 30d supply, fill #0

## 2024-05-27 ENCOUNTER — Other Ambulatory Visit: Payer: Self-pay

## 2024-06-03 ENCOUNTER — Ambulatory Visit: Admitting: Family Medicine

## 2024-06-05 ENCOUNTER — Other Ambulatory Visit: Payer: Self-pay

## 2024-06-05 ENCOUNTER — Ambulatory Visit (INDEPENDENT_AMBULATORY_CARE_PROVIDER_SITE_OTHER): Payer: Commercial Managed Care - PPO | Admitting: Family Medicine

## 2024-06-05 VITALS — BP 124/74 | HR 84 | Temp 98.0°F | Resp 16 | Ht 67.0 in | Wt 274.0 lb

## 2024-06-05 DIAGNOSIS — R519 Headache, unspecified: Secondary | ICD-10-CM | POA: Diagnosis not present

## 2024-06-05 DIAGNOSIS — R0981 Nasal congestion: Secondary | ICD-10-CM

## 2024-06-05 DIAGNOSIS — R7303 Prediabetes: Secondary | ICD-10-CM

## 2024-06-05 DIAGNOSIS — Z125 Encounter for screening for malignant neoplasm of prostate: Secondary | ICD-10-CM

## 2024-06-05 DIAGNOSIS — G8929 Other chronic pain: Secondary | ICD-10-CM

## 2024-06-05 DIAGNOSIS — Z0000 Encounter for general adult medical examination without abnormal findings: Secondary | ICD-10-CM | POA: Diagnosis not present

## 2024-06-05 DIAGNOSIS — Z1211 Encounter for screening for malignant neoplasm of colon: Secondary | ICD-10-CM

## 2024-06-05 MED ORDER — DIVALPROEX SODIUM ER 250 MG PO TB24
250.0000 mg | ORAL_TABLET | Freq: Every day | ORAL | 1 refills | Status: DC
  Filled 2024-06-05: qty 30, 30d supply, fill #0
  Filled 2024-07-04: qty 30, 30d supply, fill #1

## 2024-06-05 MED ORDER — MONTELUKAST SODIUM 10 MG PO TABS
10.0000 mg | ORAL_TABLET | Freq: Every day | ORAL | 3 refills | Status: DC
  Filled 2024-06-05: qty 90, 90d supply, fill #0
  Filled 2024-08-29: qty 90, 90d supply, fill #1

## 2024-06-05 MED ORDER — GABAPENTIN 800 MG PO TABS
800.0000 mg | ORAL_TABLET | Freq: Every day | ORAL | 3 refills | Status: AC
  Filled 2024-06-05: qty 90, 90d supply, fill #0
  Filled 2024-10-26: qty 90, 90d supply, fill #1

## 2024-06-05 NOTE — Progress Notes (Signed)
 Chief Complaint  Patient presents with   Annual Exam    CPE    Well Male Jonathon Colon is here for a complete physical.   His last physical was >1 year ago.  Current diet: in general, a healthy diet.  Current exercise: walking Weight trend: stable Fatigue out of ordinary? No. Seat belt? Yes.   Advanced directive? No  Health maintenance Shingrix - Yes Colonoscopy- Due Tetanus- Yes HIV- Yes Hep C- Yes  Migraines R sided headaches worsening over the past mo. Failed propranolol . On trazodone  and Cymbalta  for sleep/mood. Taking Mg. Maxalt  helped a bit. Had MRI ordered but has not been contacted yet. No other neuro s/s's.    Past Medical History:  Diagnosis Date   Hypertension     Past Surgical History:  Procedure Laterality Date   ESOPHAGOGASTRODUODENOSCOPY N/A 04/30/2022   Procedure: ESOPHAGOGASTRODUODENOSCOPY (EGD);  Surgeon: Rosalie Kitchens, MD;  Location: [facility];  Service: Gastroenterology;  Laterality: N/A;   KNEE SURGERY Left    ROTATOR CUFF REPAIR Right 2019   TONSILLECTOMY  1974   Medications  Current Outpatient Medications on File Prior to Visit  Medication Sig Dispense Refill   DULoxetine  (CYMBALTA ) 20 MG capsule Take 1 capsule (20 mg total) by mouth daily. 90 capsule 3   fluticasone -salmeterol (ADVAIR ) 100-50 MCG/ACT AEPB Inhale 1 puff into the lungs 2 (two) times daily. 60 each 3   ondansetron  (ZOFRAN -ODT) 4 MG disintegrating tablet Take 1 tablet (4 mg total) by mouth every 8 (eight) hours as needed for nausea or vomiting. 20 tablet 0   rizatriptan  (MAXALT ) 10 MG tablet Take 1 tablet (10 mg total) by mouth as needed for migraine. May repeat in 2 hours if needed 10 tablet 1   traZODone  (DESYREL ) 150 MG tablet Take 1 tablet (150 mg total) by mouth at bedtime. 90 tablet 2   Allergies No Known Allergies  Family History Family History  Problem Relation Age of Onset   COPD Mother    Heart failure Father    Hypertension Father    Hyperlipidemia Father     Hypertension Sister    Diabetes Sister     Review of Systems: Constitutional:  no fevers Eye:  no recent significant change in vision Ear/Nose/Mouth/Throat:  Ears:  no hearing loss Nose/Mouth/Throat:  no complaints of nasal congestion, no sore throat Cardiovascular:  no chest pain Respiratory:  no shortness of breath Gastrointestinal:  no change in bowel habits GU:  Male: negative for dysuria, frequency Musculoskeletal/Extremities:  no joint pain Integumentary (Skin/Breast):  no abnormal skin lesions reported Neurologic:  +headaches Endocrine: No unexpected weight changes Hematologic/Lymphatic:  no abnormal bleeding  Exam BP 124/74 (BP Location: Left Arm, Patient Position: Sitting)   Pulse 84   Temp 98 F (36.7 C) (Oral)   Resp 16   Ht 5' 7 (1.702 m)   Wt 274 lb (124.3 kg)   SpO2 95%   BMI 42.91 kg/m  General:  well developed, well nourished, in no apparent distress Skin:  no significant moles, warts, or growths Head:  no masses, lesions, or tenderness Eyes:  pupils equal and round, sclera anicteric without injection Ears:  canals without lesions, TMs shiny without retraction, no obvious effusion, no erythema Nose:  nares patent, mucosa normal Throat/Pharynx:  lips and gingiva without lesion; tongue and uvula midline; non-inflamed pharynx; no exudates or postnasal drainage Neck: neck supple without adenopathy, thyromegaly, or masses Cardiac: RRR, no bruits, no LE edema Lungs:  clear to auscultation, breath sounds equal bilaterally, no respiratory  distress Abdomen: BS+, soft, non-tender, non-distended, no masses or organomegaly noted Rectal: Deferred Musculoskeletal:  symmetrical muscle groups noted without atrophy or deformity Neuro:  gait normal; deep tendon reflexes normal and symmetric Psych: well oriented with normal range of affect and appropriate judgment/insight  Assessment and Plan  Well adult exam - Plan: CBC, Comprehensive metabolic panel with GFR, Lipid  panel  Screening for prostate cancer - Plan: PSA  Prediabetes - Plan: Hemoglobin A1c  Screen for colon cancer  Chronic nasal congestion - Plan: montelukast  (SINGULAIR ) 10 MG tablet  Chronic nonintractable headache, unspecified headache type - Plan: Ambulatory referral to Neurology, divalproex  (DEPAKOTE  ER) 250 MG 24 hr tablet   Well 52 y.o. male. Counseled on diet and exercise. Counseled on risks and benefits of prostate cancer screening with PSA. The patient agrees to undergo testing. Will set up Cologuard.  Will look into the MRI we ordered 5-6 weeks ago.  Migraines: Refer Neuro. Change propranolol  to Depakote  ER 250 mg/d. Could change to 500 mg/d in 1 mo.  Advanced directive form provided today.  Immunizations, labs, and further orders as above. Follow up in 6 mo. The patient voiced understanding and agreement to the plan.  Mabel Mt [place], DO 06/05/24 4:34 PM

## 2024-06-05 NOTE — Patient Instructions (Addendum)
 Give [place] 2-3 business days to get the results of your labs back.  ? ?Keep the diet clean and stay active. ? ?Please get me a copy of your advanced directive form at your convenience.  ? ?If you do not hear anything about your referral in the next 1-2 weeks, call our office and ask for an update. ? ?Let us know if you need anything. ?

## 2024-06-06 ENCOUNTER — Telehealth: Payer: Self-pay | Admitting: Family Medicine

## 2024-06-06 NOTE — Addendum Note (Signed)
 Addended by: TRUDY CURVIN RAMAN on: 06/06/2024 09:19 AM   Modules accepted: Orders

## 2024-06-06 NOTE — Telephone Encounter (Signed)
 I left a message for Jonathon Colon to come back and redo his test needs to schedule a lab appt.

## 2024-06-07 NOTE — Telephone Encounter (Signed)
 Called pt Lvm needing him come back and redo labs, needs to schedule a lab appt.

## 2024-06-11 ENCOUNTER — Encounter: Payer: Self-pay | Admitting: Family Medicine

## 2024-06-12 ENCOUNTER — Other Ambulatory Visit (HOSPITAL_COMMUNITY): Payer: Self-pay

## 2024-06-12 ENCOUNTER — Telehealth: Payer: Self-pay

## 2024-06-12 ENCOUNTER — Ambulatory Visit
Admission: RE | Admit: 2024-06-12 | Discharge: 2024-06-12 | Disposition: A | Discharge status: Home / Self Care | Source: Ambulatory Visit | Attending: Family Medicine | Admitting: Family Medicine

## 2024-06-12 ENCOUNTER — Other Ambulatory Visit: Payer: Self-pay

## 2024-06-12 ENCOUNTER — Other Ambulatory Visit: Payer: Self-pay | Admitting: Family Medicine

## 2024-06-12 DIAGNOSIS — G936 Cerebral edema: Secondary | ICD-10-CM | POA: Diagnosis not present

## 2024-06-12 DIAGNOSIS — G8929 Other chronic pain: Secondary | ICD-10-CM | POA: Insufficient documentation

## 2024-06-12 DIAGNOSIS — D320 Benign neoplasm of cerebral meninges: Secondary | ICD-10-CM | POA: Diagnosis not present

## 2024-06-12 DIAGNOSIS — R519 Headache, unspecified: Secondary | ICD-10-CM | POA: Insufficient documentation

## 2024-06-12 MED ORDER — NURTEC 75 MG PO TBDP
ORAL_TABLET | ORAL | 2 refills | Status: DC
  Filled 2024-06-12 (×2): qty 8, 30d supply, fill #0
  Filled 2024-07-29: qty 8, 30d supply, fill #1
  Filled 2024-08-29: qty 8, 30d supply, fill #2

## 2024-06-12 NOTE — Telephone Encounter (Signed)
 Pharmacy Patient Advocate Encounter  Received notification from [employer] that Prior Authorization for Nurtec 75 has been APPROVED from 06/12/24 to 12/13/24. Unable to obtain price due to refill too soon rejection, last fill date 06/12/24 next available fill date8/16/25   PA #/Case ID/Reference #: [id number]

## 2024-06-12 NOTE — Telephone Encounter (Signed)
 Pharmacy Patient Advocate Encounter   Received notification from CoverMyMeds that prior authorization for Nurtec 75 is required/requested.   Insurance verification completed.   The patient is insured through [facility] .   Per test claim: PA required; PA submitted to above mentioned insurance via CoverMyMeds Key/confirmation #/EOC [id number] Status is pending

## 2024-06-12 NOTE — Telephone Encounter (Signed)
Message sent to Pt

## 2024-06-14 ENCOUNTER — Ambulatory Visit: Payer: Self-pay | Admitting: Family Medicine

## 2024-06-14 DIAGNOSIS — D329 Benign neoplasm of meninges, unspecified: Secondary | ICD-10-CM

## 2024-06-17 ENCOUNTER — Telehealth: Payer: Self-pay

## 2024-06-17 NOTE — Telephone Encounter (Signed)
 Copied from CRM [id number]. Topic: Clinical - Lab/Test Results >> Jun 17, 2024  7:41 AM Mia F wrote: Reason for CRM: Pt called to speak with Dr Frann regarding his lab results. Pt has seen the results but want to speak with him about it. He says dr called him Friday but he missed the call. He asks to be called after 430 pm if possible.

## 2024-06-18 ENCOUNTER — Telehealth: Payer: Self-pay

## 2024-06-18 DIAGNOSIS — Z1211 Encounter for screening for malignant neoplasm of colon: Secondary | ICD-10-CM | POA: Diagnosis not present

## 2024-06-18 NOTE — Telephone Encounter (Signed)
 Copied from CRM [id number]. 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 Patient  call back number is [phone]   Called pt but no answer, and Lvm.

## 2024-06-18 NOTE — Telephone Encounter (Unsigned)
 Copied from CRM [id number]. Topic: Clinical - Lab/Test Results >> Jun 17, 2024  4:55 PM [name] wrote: Reason for CRM: Patient is returning call for Jonathon Colon regarding test results. He states he works 3rd shift and can be contacted soon as practice open to speak about results. Please contact him back soon to discuss

## 2024-06-18 NOTE — Telephone Encounter (Signed)
 Called pt lvm letting him know I was returning his call to discuss labs.

## 2024-06-18 NOTE — Telephone Encounter (Signed)
 Copied from CRM [id number]. Topic: Clinical - Lab/Test Results >> Jun 18, 2024  4:44 PM Jasmin G wrote: Reason for CRM: Pt would like a call from PCP regarding recent MRI results, he would like to discuss the results over, please call him back ASAP  Called spoke with Pt and Dr.Wendling said he would  Give him a call later this afternoon. Pt stated understand.

## 2024-06-23 LAB — COLOGUARD: COLOGUARD: NEGATIVE

## 2024-06-24 ENCOUNTER — Ambulatory Visit: Payer: Self-pay | Admitting: Family Medicine

## 2024-06-25 ENCOUNTER — Other Ambulatory Visit: Payer: Self-pay | Admitting: Family Medicine

## 2024-06-25 DIAGNOSIS — Z125 Encounter for screening for malignant neoplasm of prostate: Secondary | ICD-10-CM

## 2024-06-25 DIAGNOSIS — R7303 Prediabetes: Secondary | ICD-10-CM

## 2024-06-25 DIAGNOSIS — Z0000 Encounter for general adult medical examination without abnormal findings: Secondary | ICD-10-CM

## 2024-06-26 ENCOUNTER — Other Ambulatory Visit: Payer: Self-pay

## 2024-06-26 DIAGNOSIS — R7303 Prediabetes: Secondary | ICD-10-CM

## 2024-06-26 LAB — CBC
HCT: 41.1 % (ref 39.0–52.0)
Hemoglobin: 14 g/dL (ref 13.0–17.0)
MCHC: 34.2 g/dL (ref 30.0–36.0)
MCV: 90.5 fl (ref 78.0–100.0)
Platelets: 252 K/uL (ref 150.0–400.0)
RBC: 4.54 Mil/uL (ref 4.22–5.81)
RDW: 13.5 % (ref 11.5–15.5)
WBC: 6.3 K/uL (ref 4.0–10.5)

## 2024-06-26 LAB — LIPID PANEL
Cholesterol: 129 mg/dL (ref 0–200)
HDL: 29.7 mg/dL — ABNORMAL LOW (ref >39.00)
LDL Cholesterol: 73 mg/dL (ref 0–99)
NonHDL: 99.53
Total CHOL/HDL Ratio: 4
Triglycerides: 135 mg/dL (ref 0.0–149.0)
VLDL: 27 mg/dL (ref 0.0–40.0)

## 2024-06-26 LAB — COMPREHENSIVE METABOLIC PANEL WITH GFR
ALT: 23 U/L (ref 0–53)
AST: 16 U/L (ref 0–37)
Albumin: 4.1 g/dL (ref 3.5–5.2)
Alkaline Phosphatase: 66 U/L (ref 39–117)
BUN: 8 mg/dL (ref 6–23)
CO2: 31 meq/L (ref 19–32)
Calcium: 9.4 mg/dL (ref 8.4–10.5)
Chloride: 104 meq/L (ref 96–112)
Creatinine, Ser: 0.8 mg/dL (ref 0.40–1.50)
GFR: 100.11 mL/min (ref >60.00)
Glucose, Bld: 103 mg/dL — ABNORMAL HIGH (ref 70–99)
Potassium: 4.8 meq/L (ref 3.5–5.1)
Sodium: 141 meq/L (ref 135–145)
Total Bilirubin: 0.5 mg/dL (ref 0.2–1.2)
Total Protein: 7 g/dL (ref 6.0–8.3)

## 2024-06-26 LAB — PSA: PSA: 0.51 ng/mL (ref 0.10–4.00)

## 2024-06-26 LAB — HEMOGLOBIN A1C: Hgb A1c MFr Bld: 7 % — ABNORMAL HIGH (ref 4.6–6.5)

## 2024-07-05 ENCOUNTER — Encounter: Payer: Self-pay | Admitting: Neurosurgery

## 2024-07-05 ENCOUNTER — Ambulatory Visit (INDEPENDENT_AMBULATORY_CARE_PROVIDER_SITE_OTHER): Admitting: Neurosurgery

## 2024-07-05 ENCOUNTER — Other Ambulatory Visit: Payer: Self-pay

## 2024-07-05 VITALS — BP 143/98 | HR 86 | Ht 70.0 in | Wt 273.0 lb

## 2024-07-05 DIAGNOSIS — D329 Benign neoplasm of meninges, unspecified: Secondary | ICD-10-CM

## 2024-07-05 DIAGNOSIS — D496 Neoplasm of unspecified behavior of brain: Secondary | ICD-10-CM | POA: Diagnosis not present
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
## 2024-07-23 ENCOUNTER — Ambulatory Visit: Admitting: Neurology

## 2024-07-23 ENCOUNTER — Encounter: Payer: Self-pay | Admitting: Neurology

## 2024-07-23 ENCOUNTER — Other Ambulatory Visit: Payer: Self-pay

## 2024-07-23 VITALS — BP 133/87 | HR 99 | Ht 70.0 in | Wt 271.0 lb

## 2024-07-23 DIAGNOSIS — G43709 Chronic migraine without aura, not intractable, without status migrainosus: Secondary | ICD-10-CM | POA: Diagnosis not present

## 2024-07-23 MED ORDER — AJOVY 225 MG/1.5ML ~~LOC~~ SOAJ
225.0000 mg | SUBCUTANEOUS | 11 refills | Status: AC
  Filled 2024-07-23 – 2024-08-21 (×2): qty 1.5, 30d supply, fill #0
  Filled 2024-09-26 – 2024-10-26 (×3): qty 1.5, 30d supply, fill #1
  Filled 2024-11-22: qty 1.5, 30d supply, fill #0
  Filled 2024-12-23: qty 1.5, 30d supply, fill #1
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
## 2024-07-23 NOTE — Patient Instructions (Signed)
 Start Ajovy  monthly Other options includes Emgality or Aimovig, Vyepti, Qulipta and botox for migraines.   Fremanezumab  Injection What is this medication? FREMANEZUMAB  (fre ma NEZ ue mab) prevents migraines. It works by blocking a substance in the body that causes migraines. It is a monoclonal antibody. This medicine may be used for other purposes; ask your health care provider or pharmacist if you have questions. COMMON BRAND NAME(S): AJOVY  What should I tell my care team before I take this medication? They need to know if you have any of these conditions: Circulation problems in fingers or toes (Raynaud syndrome) High blood pressure An unusual or allergic reaction to fremanezumab , other medications, foods, dyes, or preservatives Pregnant or trying to get pregnant Breastfeeding How should I use this medication? This medication is injected under the skin. You will be taught how to prepare and give it. Take it as directed on the prescription label. Keep taking it unless your care team tells you to stop. It is important that you put your used needles and syringes in a special sharps container. Do not put them in a trash can. If you do not have a sharps container, call your pharmacist or care team to get one. Talk to your care team about the use of this medication in children. Special care may be needed. Overdosage: If you think you have taken too much of this medicine contact a poison control center or emergency room at once. NOTE: This medicine is only for you. Do not share this medicine with others. What if I miss a dose? If you miss a dose, take it as soon as you can. If it is almost time for your next dose, take only that dose. Do not take double or extra doses. What may interact with this medication? Interactions are not expected. This list may not describe all possible interactions. Give your health care provider a list of all the medicines, herbs, non-prescription drugs, or dietary  supplements you use. Also tell them if you smoke, drink alcohol, or use illegal drugs. Some items may interact with your medicine. What should I watch for while using this medication? Visit your care team for regular checks on your progress. Tell your care team if your symptoms do not start to get better or if they get worse. What side effects may I notice from receiving this medication? Side effects that you should report to your care team as soon as possible: Allergic reactions or angioedema--skin rash, itching or hives, swelling of the face, eyes, lips, tongue, arms, or legs, trouble swallowing or breathing Increase in blood pressure Raynaud syndrome--cool, numb, or painful fingers or toes that may change color from pale, to blue, to red Side effects that usually do not require medical attention (report these to your care team if they continue or are bothersome): Pain, redness, or irritation at injection site This list may not describe all possible side effects. Call your doctor for medical advice about side effects. You may report side effects to FDA at 1-800-FDA-1088. Where should I keep my medication? Keep out of the reach of children and pets. Store in a refrigerator or at room temperature between 20 and 25 degrees C (68 and 77 degrees F). Refrigeration (preferred): Store in the refrigerator. Do not freeze. Keep in the original container until you are ready to take it. Remove the dose from the carton about 30 minutes before it is time for you to use it. If the dose is not used, it may be stored  in the original container at room temperature for 7 days. Get rid of any unused medication after the expiration date. Room Temperature: This medication may be stored at room temperature for up to 7 days. Keep it in the original container. Protect from light until time of use. If it is stored at room temperature, get rid of any unused medication after 7 days or after it expires, whichever is first. To  get rid of medications that are no longer needed or have expired: Take the medication to a medication take-back program. Check with your pharmacy or law enforcement to find a location. If you cannot return the medication, ask your pharmacist or care team how to get rid of this medication safely. NOTE: This sheet is a summary. It may not cover all possible information. If you have questions about this medicine, talk to your doctor, pharmacist, or health care provider.  2025 Elsevier/Gold Standard (2024-02-15 00:00:00)

## 2024-07-29 ENCOUNTER — Other Ambulatory Visit: Payer: Self-pay

## 2024-07-29 ENCOUNTER — Other Ambulatory Visit: Payer: Self-pay | Admitting: Family Medicine

## 2024-07-29 DIAGNOSIS — R519 Headache, unspecified: Secondary | ICD-10-CM

## 2024-07-29 MED ORDER — DIVALPROEX SODIUM ER 250 MG PO TB24
250.0000 mg | ORAL_TABLET | Freq: Every day | ORAL | 1 refills | Status: DC
  Filled 2024-07-29: qty 30, 30d supply, fill #0
  Filled 2024-08-29: qty 30, 30d supply, fill #1

## 2024-07-29 MED ORDER — DULOXETINE HCL 20 MG PO CPEP
20.0000 mg | ORAL_CAPSULE | Freq: Every day | ORAL | 1 refills | Status: AC
  Filled 2024-07-29: qty 90, 90d supply, fill #0
  Filled 2024-10-26: qty 90, 90d supply, fill #1

## 2024-08-02 ENCOUNTER — Other Ambulatory Visit: Payer: Self-pay

## 2024-08-21 ENCOUNTER — Other Ambulatory Visit: Payer: Self-pay

## 2024-08-21 ENCOUNTER — Encounter: Payer: Self-pay | Admitting: Family Medicine

## 2024-09-03 ENCOUNTER — Telehealth: Payer: Self-pay

## 2024-09-03 ENCOUNTER — Other Ambulatory Visit: Payer: Self-pay

## 2024-09-03 NOTE — Telephone Encounter (Signed)
 Copied from CRM [id number]. Topic: Clinical - Medical Advice >> Sep 03, 2024  3:44 PM Shereese L wrote: Reason for CRM: Patient stated that he has 8 days remaining to return his FMLA forms in order to meet the deadline. Patient is requesting a call back as soon as possible so he can get those form sent over  Called pt was advised we haven't received any FMLA  form as of yet. Advised pt to have forms refax or get a copy to us . Pt stated he will reach us  to them.

## 2024-09-04 ENCOUNTER — Telehealth: Payer: Self-pay | Admitting: Family Medicine

## 2024-09-04 ENCOUNTER — Telehealth: Admitting: Family Medicine

## 2024-09-04 DIAGNOSIS — G43709 Chronic migraine without aura, not intractable, without status migrainosus: Secondary | ICD-10-CM

## 2024-09-04 NOTE — Progress Notes (Signed)
 Chief Complaint  Patient presents with   FMLA    Discuss FMLA    Subjective: Patient is a 53 y.o. male here for f/u. We are interacting via web portal for an electronic face-to-face visit. I verified patient's ID using 2 identifiers. Patient agreed to proceed with visit via this method. Patient is at home, I am at office. Patient and I are present for visit.   Patient has a history of migraines.  Takes Ajovy  injections once monthly.  He is also on Nurtec as needed.  This combination has been very helpful for him.  He rarely needs to take any abortive therapy.  His neurologist left the office and he needs FMLA forms filled out should he have a flare where he needs to miss work.  No new neurologic symptoms otherwise.   Past Medical History:  Diagnosis Date   Hypertension     Objective: No conversational dyspnea Age appropriate judgment and insight Nml affect and mood  Assessment and Plan: Chronic migraine without aura without status migrainosus, not intractable  Chronic, stable.  Continue Ajovy  225 mg every 30 days, Nurtec 75 mg daily as needed.  FMLA forms filled out and faxed today.  He will come tomorrow to pick it up in person as well.  Follow-up as originally scheduled. The patient voiced understanding and agreement to the plan.  Mabel [place], DO 09/04/24  5:09 PM

## 2024-09-04 NOTE — Telephone Encounter (Signed)
 Pt needs forms filled out. Pt states that he will call with fax number. Pt wants forms faxed and to pick them up. Pt states he needs these no later than tomorrow. Forms placed in pcp's bin in front office. Paperwork is ready for review

## 2024-09-04 NOTE — Telephone Encounter (Signed)
Paperwork placed in provider bin for review.

## 2024-09-04 NOTE — Telephone Encounter (Signed)
 Pt needs forms filled out. Pt states that he will call with fax number. Pt wants forms faxed and to pick them up. Pt states he needs these no later than tomorrow. Forms placed in pcp's bin in front office.

## 2024-09-11 ENCOUNTER — Telehealth: Payer: Self-pay

## 2024-09-11 NOTE — Telephone Encounter (Signed)
 Received [employer] FMLA form. Mychart message sent to patient regarding form fee and questionnaire, as well as reassigning MD.

## 2024-09-26 ENCOUNTER — Other Ambulatory Visit: Payer: Self-pay

## 2024-09-26 ENCOUNTER — Other Ambulatory Visit: Payer: Self-pay | Admitting: Family Medicine

## 2024-09-26 DIAGNOSIS — G8929 Other chronic pain: Secondary | ICD-10-CM

## 2024-09-26 MED ORDER — DIVALPROEX SODIUM ER 250 MG PO TB24
250.0000 mg | ORAL_TABLET | Freq: Every day | ORAL | 1 refills | Status: DC
  Filled 2024-09-26: qty 30, 30d supply, fill #0
  Filled 2024-10-26: qty 30, 30d supply, fill #1

## 2024-10-15 ENCOUNTER — Other Ambulatory Visit (HOSPITAL_COMMUNITY): Payer: Self-pay

## 2024-10-15 ENCOUNTER — Telehealth: Payer: Self-pay

## 2024-10-15 NOTE — Telephone Encounter (Signed)
 Clinical questions have been answered and PA submitted. PA currently Pending. Please be advised that most companies allow up to 30 days to make a decision. We will advise when a determination has been made, or follow up in 1 week.   Please reach out to our team, Rx Prior Auth Pool, if you haven't heard back in a week.

## 2024-10-15 NOTE — Telephone Encounter (Signed)
 Pharmacy Patient Advocate Encounter   Received notification from CoverMyMeds that prior authorization for Ajovy  is required/requested.   Insurance verification completed.   The patient is insured through [facility].   Per test claim: PA required; PA started via CoverMyMeds. KEY [id number] . Waiting for clinical questions to populate.

## 2024-10-16 NOTE — Telephone Encounter (Signed)
 Pharmacy Patient Advocate Encounter  Received notification from [facility] that Prior Authorization for Ajovy  has been APPROVED from 10/15/2024 to 04/13/2025   PA #/Case ID/Reference #: [id number]

## 2024-11-04 ENCOUNTER — Other Ambulatory Visit: Payer: Self-pay

## 2024-11-17 ENCOUNTER — Other Ambulatory Visit: Payer: Self-pay | Admitting: Family Medicine

## 2024-11-18 ENCOUNTER — Other Ambulatory Visit: Payer: Self-pay

## 2024-11-18 MED ORDER — NURTEC 75 MG PO TBDP
ORAL_TABLET | ORAL | 2 refills | Status: AC
  Filled 2024-11-18: qty 8, 4d supply, fill #0
  Filled 2024-11-18 – 2024-11-22 (×2): qty 8, 30d supply, fill #0
  Filled 2024-12-23 – 2024-12-24 (×2): qty 8, 30d supply, fill #1

## 2024-11-22 ENCOUNTER — Other Ambulatory Visit: Payer: Self-pay

## 2024-11-27 ENCOUNTER — Other Ambulatory Visit: Payer: Self-pay | Admitting: Family Medicine

## 2024-11-27 DIAGNOSIS — R519 Headache, unspecified: Secondary | ICD-10-CM

## 2024-11-28 ENCOUNTER — Other Ambulatory Visit: Payer: Self-pay

## 2024-11-28 MED ORDER — DIVALPROEX SODIUM ER 250 MG PO TB24
250.0000 mg | ORAL_TABLET | Freq: Every day | ORAL | 1 refills | Status: AC
  Filled 2024-11-28 – 2024-12-02 (×2): qty 30, 30d supply, fill #0

## 2024-12-02 ENCOUNTER — Other Ambulatory Visit (HOSPITAL_COMMUNITY): Payer: Self-pay

## 2024-12-02 ENCOUNTER — Other Ambulatory Visit: Payer: Self-pay

## 2024-12-03 ENCOUNTER — Other Ambulatory Visit: Payer: Self-pay

## 2024-12-06 ENCOUNTER — Other Ambulatory Visit: Payer: Self-pay

## 2024-12-06 ENCOUNTER — Ambulatory Visit: Admitting: Family Medicine

## 2024-12-06 ENCOUNTER — Encounter: Payer: Self-pay | Admitting: Family Medicine

## 2024-12-06 VITALS — BP 134/82 | HR 85 | Temp 98.0°F | Resp 16 | Ht 70.0 in | Wt 271.0 lb

## 2024-12-06 DIAGNOSIS — R0981 Nasal congestion: Secondary | ICD-10-CM

## 2024-12-06 DIAGNOSIS — F411 Generalized anxiety disorder: Secondary | ICD-10-CM | POA: Diagnosis not present

## 2024-12-06 DIAGNOSIS — G43709 Chronic migraine without aura, not intractable, without status migrainosus: Secondary | ICD-10-CM

## 2024-12-06 DIAGNOSIS — Z1211 Encounter for screening for malignant neoplasm of colon: Secondary | ICD-10-CM | POA: Diagnosis not present

## 2024-12-06 DIAGNOSIS — Z23 Encounter for immunization: Secondary | ICD-10-CM | POA: Diagnosis not present

## 2024-12-06 MED ORDER — MONTELUKAST SODIUM 10 MG PO TABS
10.0000 mg | ORAL_TABLET | Freq: Every day | ORAL | 3 refills | Status: AC
  Filled 2024-12-06: qty 90, 90d supply, fill #0

## 2024-12-06 NOTE — Progress Notes (Signed)
 Chief Complaint  Patient presents with   Follow-up    Follow Up    Subjective Jonathon Colon presents for f/u anxiety.  Pt is currently being treated with Cymbalta  20 mg/d.  Reports doing well since treatment. No thoughts of harming self or others. No self-medication with alcohol, prescription drugs or illicit drugs. Pt is not following with a counselor/psychologist.  Migraines Taking Ajovy , headaches controlled. Also on Nurtec prn. No AE's, works most of the time. No current headache.   Past Medical History:  Diagnosis Date   Hypertension    Allergies as of 12/06/2024   No Known Allergies      Medication List        Accurate as of December 06, 2024  4:42 PM. If you have any questions, ask your nurse or doctor.          Ajovy  225 MG/1.5ML Soaj Generic drug: Fremanezumab -vfrm Inject 225 mg into the skin every 30 (thirty) days.   divalproex  250 MG 24 hr tablet Commonly known as: Depakote  ER Take 1 tablet (250 mg total) by mouth daily.   DULoxetine  20 MG capsule Commonly known as: Cymbalta  Take 1 capsule (20 mg total) by mouth daily.   fluticasone -salmeterol 100-50 MCG/ACT Aepb Commonly known as: ADVAIR  Inhale 1 puff into the lungs 2 (two) times daily. What changed:  when to take this reasons to take this   gabapentin  800 MG tablet Commonly known as: Neurontin  Take 1 tablet (800 mg total) by mouth at bedtime.   montelukast  10 MG tablet Commonly known as: SINGULAIR  Take 1 tablet (10 mg total) by mouth at bedtime.   Nurtec 75 MG Tbdp Generic drug: Rimegepant Sulfate  Take 1 tablet at onset of migraine and repeat in 2 hours if no improvement.   ondansetron  4 MG disintegrating tablet Commonly known as: ZOFRAN -ODT Take 1 tablet (4 mg total) by mouth every 8 (eight) hours as needed for nausea or vomiting.   traZODone  150 MG tablet Commonly known as: DESYREL  Take 1 tablet (150 mg total) by mouth at bedtime.        Exam BP 134/82 (BP Location: Left  Arm, Patient Position: Sitting)   Pulse 85   Temp 98 F (36.7 C) (Oral)   Resp 16   Ht 5' 10 (1.778 m)   Wt 271 lb (122.9 kg)   SpO2 95%   BMI 38.88 kg/m  General:  well developed, well nourished, in no apparent distress Neuro: DTRs equal and symmetric throughout, no clonus, no cerebellar signs, 5/5 strength throughout, gait is normal MSK: Mild hypertonicity and TTP over the trapezius musculature bilaterally and lateral neck musculature; no bony tenderness Lungs:  No respiratory distress Psych: well oriented with normal range of affect and age-appropriate judgement/insight, alert and oriented x4.  Assessment and Plan  GAD (generalized anxiety disorder)  Chronic migraine without aura without status migrainosus, not intractable  Need for vaccination against Streptococcus pneumoniae  Chronic nasal congestion - Plan: montelukast  (SINGULAIR ) 10 MG tablet  Screen for colon cancer - Plan: Ambulatory referral to Gastroenterology  Chronic, stable.  Continue duloxetine  20 mg daily. Chronic, stable.  Continue Depakote  ER 250 mg daily, Ajovy  225 mg every 30 days, Nurtec as needed. PCV 20 today. Continue Singulair  10 mg daily as needed. Refer to GI in [place] near his home. F/u in 6 months. The patient voiced understanding and agreement to the plan.  Mabel Mt [place], DO 12/06/24 4:42 PM

## 2024-12-06 NOTE — Patient Instructions (Addendum)
 If you do not hear anything about your referral in the next 1-2 weeks, call our office and ask for an update.  Let us know if you need anything.

## 2024-12-23 ENCOUNTER — Telehealth (HOSPITAL_COMMUNITY): Payer: Self-pay

## 2024-12-23 ENCOUNTER — Other Ambulatory Visit: Payer: Self-pay | Admitting: Family Medicine

## 2024-12-23 ENCOUNTER — Other Ambulatory Visit (HOSPITAL_COMMUNITY): Payer: Self-pay

## 2024-12-23 ENCOUNTER — Other Ambulatory Visit: Payer: Self-pay

## 2024-12-23 DIAGNOSIS — G4720 Circadian rhythm sleep disorder, unspecified type: Secondary | ICD-10-CM

## 2024-12-23 DIAGNOSIS — G4700 Insomnia, unspecified: Secondary | ICD-10-CM

## 2024-12-23 MED ORDER — TRAZODONE HCL 150 MG PO TABS
150.0000 mg | ORAL_TABLET | Freq: Every day | ORAL | 1 refills | Status: AC
  Filled 2024-12-23: qty 90, 90d supply, fill #0

## 2024-12-24 ENCOUNTER — Other Ambulatory Visit: Payer: Self-pay

## 2024-12-24 ENCOUNTER — Other Ambulatory Visit (HOSPITAL_COMMUNITY): Payer: Self-pay

## 2024-12-24 ENCOUNTER — Telehealth (HOSPITAL_COMMUNITY): Payer: Self-pay

## 2024-12-24 NOTE — Telephone Encounter (Signed)
 Pharmacy Patient Advocate Encounter   Received notification from Pt Calls Messages that prior authorization for Nurtec 75MG  dispersible tablets  is required/requested.   Insurance verification completed.   The patient is insured through [facility].   Per test claim: PA required; PA submitted to above mentioned insurance via Latent Key/confirmation #/EOC [id number] Status is pending

## 2024-12-24 NOTE — Telephone Encounter (Signed)
 PA request has been Received. New Encounter has been or will be created for follow up. For additional info see Pharmacy Prior Auth telephone encounter from 12/24/24.

## 2024-12-24 NOTE — Telephone Encounter (Signed)
 Pharmacy Patient Advocate Encounter  Received notification from [facility] that Prior Authorization for Nurtec 75MG  dispersible tablets  has been APPROVED from 12/24/24 to 12/23/25. Ran test claim, Copay is $0. This test claim was processed through [facility]- copay amounts may vary at other pharmacies due to pharmacy/plan contracts, or as the patient moves through the different stages of their insurance plan.   PA #/Case ID/Reference #: [id number]

## 2025-02-13 ENCOUNTER — Telehealth: Admitting: Adult Health

## 2025-07-01 ENCOUNTER — Ambulatory Visit: Admitting: Neurosurgery
